# Patient Record
Sex: Male | Born: 1985 | Race: White | Hispanic: No | Marital: Married | Smoking: Never smoker
Health system: Southern US, Community
[De-identification: ages and names within clinical notes are randomized; demographics above are authoritative.]

## PROBLEM LIST (undated history)

## (undated) DIAGNOSIS — K602 Anal fissure, unspecified: Secondary | ICD-10-CM

## (undated) DIAGNOSIS — K648 Other hemorrhoids: Secondary | ICD-10-CM

## (undated) DIAGNOSIS — K9049 Malabsorption due to intolerance, not elsewhere classified: Secondary | ICD-10-CM

## (undated) HISTORY — DX: Anal fissure, unspecified: K60.2

## (undated) HISTORY — PX: WISDOM TOOTH EXTRACTION: SHX21

## (undated) HISTORY — DX: Other hemorrhoids: K64.8

## (undated) HISTORY — DX: Malabsorption due to intolerance, not elsewhere classified: K90.49

## (undated) HISTORY — PX: VASECTOMY: SHX75

---

## 2011-11-29 ENCOUNTER — Encounter: Payer: Self-pay | Admitting: Emergency Medicine

## 2011-11-29 ENCOUNTER — Ambulatory Visit (INDEPENDENT_AMBULATORY_CARE_PROVIDER_SITE_OTHER): Payer: BC Managed Care – PPO | Admitting: Family Medicine

## 2011-11-29 VITALS — BP 110/70 | HR 85 | Temp 98.1°F | Resp 16 | Ht 71.5 in | Wt 213.0 lb

## 2011-11-29 DIAGNOSIS — Z0000 Encounter for general adult medical examination without abnormal findings: Secondary | ICD-10-CM

## 2011-11-29 NOTE — Progress Notes (Signed)
    Date:  11/29/2011   Name:  Dennis Owens   DOB:  1985/12/30   MRN:  [id number]  PCP:  No primary provider on file.    Chief Complaint: Annual Exam   History of Present Illness:  Dennis Owens is a 26 y.o. 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  He is generally healthy, works at [employer] currently.  Exercises regularly.  No tobacco use.  He is married and has one child.    [name] does not want blood work today.  He had his last tetanus shot while in the [employer]- he states this was less than 10 years ago.  He also thinks that he has had a hep b series already.    No trouble with CP, SOB, syncope, etc while exercising.    There is no problem list on file for this patient.  No past medical history on file. No past surgical history on file. History  Substance Use Topics  . Smoking status: Never Smoker   . Smokeless tobacco: Not on file  . Alcohol Use: Not on file   No family history on file. Allergies  Allergen Reactions  . Penicillins Rash    Medication list has been reviewed and updated.  No current outpatient prescriptions on file prior to visit.    Review of Systems:  As per HPI- otherwise negative.   Physical Examination: Filed Vitals:   11/29/11 0806  BP: 110/70  Pulse: 85  Temp: 98.1 F (36.7 C)  Resp: 16   Filed Vitals:   11/29/11 0806  Height: 5' 11.5" (1.816 m)  Weight: 213 lb (96.616 kg)   Body mass index is 29.29 kg/(m^2). Ideal Body Weight: Weight in (lb) to have BMI = 25: 181.4   GEN: WDWN, NAD, Non-toxic, A & O x 3 HEENT: Atraumatic, Normocephalic. Neck supple. No masses, No LAD.  PEERL, EOMI, TM and oropharynx wnl Ears and Nose: No external deformity. CV: RRR, No M/G/R. No JVD. No thrill. No extra heart sounds. PULM: CTA B, no wheezes, crackles, rhonchi. No retractions. No resp. distress. No accessory muscle use. ABD: S, NT, ND, +BS. No rebound. No  HSM. EXTR: No c/c/e NEURO Normal gait. Full strength and ROM all extremities, normal DTR PSYCH: Normally interactive. Conversant. Not depressed or anxious appearing.  Calm demeanor.  GU: no masses, normal external genitalia, no inguinal hernias    Assessment and Plan: 1. 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  Gave hand out "staying healthy."  He will let us know if we can do anything else to help Abbe Amsterdam, MD

## 2012-12-11 ENCOUNTER — Ambulatory Visit (INDEPENDENT_AMBULATORY_CARE_PROVIDER_SITE_OTHER): Payer: BC Managed Care – PPO | Admitting: Family Medicine

## 2012-12-11 VITALS — BP 122/80 | HR 84 | Temp 98.3°F | Resp 18 | Ht 72.0 in | Wt 214.0 lb

## 2012-12-11 DIAGNOSIS — T148XXA Other injury of unspecified body region, initial encounter: Secondary | ICD-10-CM

## 2012-12-11 DIAGNOSIS — M549 Dorsalgia, unspecified: Secondary | ICD-10-CM

## 2012-12-11 MED ORDER — CYCLOBENZAPRINE HCL 5 MG PO TABS: 5.0000 mg | ORAL_TABLET | Freq: Every evening | ORAL | Status: DC | PRN

## 2012-12-11 NOTE — Progress Notes (Signed)
 [facility]:  Office Visit  Chief Complaint:  Chief Complaint  Patient presents with  . Shoulder Pain    left shoulder since sunday     HPI: Dennis Owens is a 27 y.o. male who complains of  Left shoulder blade pain, really sore. He changed the breaks this weekend. NKI. He has had shoulde rstrain previously in the past. He took Aleve with some relief. Last shoulder problems last year, right shoulder. No neck pain, No numbness or ringling. Overhead lifting makes pain worse. No weakness. 10/10 sharp pain this AM, IN the back of the shoulder. Goes to  [facility].  HE works at [employer] in [employer]. He does a lot of lifting, 150 lb is heaviest.   History reviewed. No pertinent past medical history. History reviewed. No pertinent past surgical history. History   Social History  . Marital Status: Married    Spouse Name: N/A    Number of Children: N/A  . Years of Education: N/A   Social History Main Topics  . Smoking status: Never Smoker   . Smokeless tobacco: None  . Alcohol Use: None  . Drug Use: None  . Sexually Active: None   Other Topics Concern  . None   Social History Narrative  . None   History reviewed. No pertinent family history. Allergies  Allergen Reactions  . Penicillins Rash   Prior to Admission medications   Medication Sig Start Date End Date Taking? Authorizing Provider  Acetaminophen (TYNOL 8 HOUR PO) Take by mouth.   Yes Historical Provider, MD     ROS: The patient denies fevers, chills, night sweats, unintentional weight loss, chest pain, palpitations, wheezing, dyspnea on exertion, nausea, vomiting, abdominal pain, dysuria, hematuria, melena, numbness, weakness, or tingling.   All other systems have been reviewed and were otherwise negative with the exception of those mentioned in the HPI and as above.    PHYSICAL EXAM: Filed Vitals:   12/11/12 0856  BP: 122/80  Pulse: 84  Temp: 98.3 F (36.8 C)  Resp: 18   Filed Vitals:   12/11/12 0856  Height: 6' (1.829 m)  Weight: 214 lb (97.07 kg)   Body mass index is 29.02 kg/(m^2).  General: Alert, no acute distress HEENT:  Normocephalic, atraumatic, oropharynx patent.  Cardiovascular:  Regular rate and rhythm, no rubs murmurs or gallops.  No Carotid bruits, radial pulse intact. No pedal edema.  Respiratory: Clear to auscultation bilaterally.  No wheezes, rales, or rhonchi.  No cyanosis, no use of accessory musculature GI: No organomegaly, abdomen is soft and non-tender, positive bowel sounds.  No masses. Skin: No rashes. Neurologic: Facial musculature symmetric. Psychiatric: Patient is appropriate throughout our interaction. Lymphatic: No cervical lymphadenopathy Musculoskeletal: Gait intact. Neck normal, neg spurling Back-left back, bladder, + tender at shoulder balde angle Full ROM, neg Hawkins,Neers, 5/5 sterngth, 2/2 DTR  LABS: No results found for this or any previous visit.   EKG/XRAY:   Primary read interpreted by Dr. Conley Rolls at [facility].   ASSESSMENT/PLAN: Encounter Diagnoses  Name Primary?  . Back pain Yes  . Sprain and strain    Continue with Aleve Rx for flexeril given, take as needed Work note given ROM exercises given F/u prn Declined xrays Gross sideeffects, risk and benefits, and alternatives of medications d/w patient. Patient is aware that all medications have potential sideeffects and we are unable to predict every sideeffect or drug-drug interaction that may occur.     ,  PHUONG, DO 12/11/2012  9:33 AM

## 2012-12-11 NOTE — Patient Instructions (Signed)

## 2013-09-26 LAB — HM COLONOSCOPY

## 2013-10-16 ENCOUNTER — Emergency Department (HOSPITAL_COMMUNITY)
Admission: EM | Admit: 2013-10-16 | Discharge: 2013-10-16 | Disposition: A | Discharge status: Home / Self Care | Payer: BC Managed Care – PPO | Source: Home / Self Care | Attending: Family Medicine | Admitting: Family Medicine

## 2013-10-16 ENCOUNTER — Emergency Department (INDEPENDENT_AMBULATORY_CARE_PROVIDER_SITE_OTHER): Payer: BC Managed Care – PPO

## 2013-10-16 ENCOUNTER — Encounter (HOSPITAL_COMMUNITY): Payer: Self-pay | Admitting: Emergency Medicine

## 2013-10-16 DIAGNOSIS — X58XXXA Exposure to other specified factors, initial encounter: Secondary | ICD-10-CM

## 2013-10-16 DIAGNOSIS — S6000XA Contusion of unspecified finger without damage to nail, initial encounter: Secondary | ICD-10-CM

## 2013-10-16 NOTE — ED Provider Notes (Signed)
Dennis Owens is a 28 y.o. male who presents to [facility] today for left third digit injury. Injury occurred yesterday while moving heavy cardboard boxes. His finger was crushed. He notes pain at the PIP. Pain with flexion. No radiating pain weakness or numbness. He feels well otherwise. Pain is moderate. Injury occurred At home   History reviewed. No pertinent past medical history. History  Substance Use Topics  . Smoking status: Never Smoker   . Smokeless tobacco: Not on file  . Alcohol Use: Yes   ROS as above Medications: No current facility-administered medications for this encounter.   Current Outpatient Prescriptions  Medication Sig Dispense Refill  . Acetaminophen (TYLENOL 8 HOUR PO) Take by mouth.      . cyclobenzaprine (FLEXERIL) 5 MG tablet Take 1 tablet (5 mg total) by mouth at bedtime as needed.  15 tablet  0    Exam:  BP 142/71  Pulse 80  Temp(Src) 98 F (36.7 C) (Oral)  Resp 12  SpO2 100% Gen: Well NAD Left hand: Mild swelling at the proximal left third digit. Pain with flexion with reduced range of motion. Capillary refill sensation and pulses are intact.  No results found for this or any previous visit (from the past 24 hour(s)). Dg Finger Middle Left  10/16/2013   CLINICAL DATA:  Left middle finger pain and swelling post injury yesterday  EXAM: LEFT MIDDLE FINGER 2+V  COMPARISON:  None.  FINDINGS: Three views of left third finger submitted. No acute fracture or subluxation. A ring noted on fourth finger.  IMPRESSION: Negative.   Electronically Signed   By: Natasha Mead M.D.   On: 10/16/2013 12:48    Assessment and Plan: 28 y.o. male with finger contusion. Plan for buddy tape and NSAIDs.  Discussed warning signs or symptoms. Please see discharge instructions. Patient expresses understanding.    Rodolph Bong, MD 10/16/13 1400

## 2013-10-16 NOTE — Discharge Instructions (Signed)
Thank you for coming in today. Take up to 2 aleve twice daily for pain as needed.  Buddy tape the fingers.  Come back as needed.    Contusion A contusion is a deep bruise. Contusions are the result of an injury that caused bleeding under the skin. The contusion may turn blue, purple, or yellow. Minor injuries will give you a painless contusion, but more severe contusions may stay painful and swollen for a few weeks.  CAUSES  A contusion is usually caused by a blow, trauma, or direct force to an area of the body. SYMPTOMS   Swelling and redness of the injured area.  Bruising of the injured area.  Tenderness and soreness of the injured area.  Pain. DIAGNOSIS  The diagnosis can be made by taking a history and physical exam. An X-ray, CT scan, or MRI may be needed to determine if there were any associated injuries, such as fractures. TREATMENT  Specific treatment will depend on what area of the body was injured. In general, the best treatment for a contusion is resting, icing, elevating, and applying cold compresses to the injured area. Over-the-counter medicines may also be recommended for pain control. Ask your caregiver what the best treatment is for your contusion. HOME CARE INSTRUCTIONS   Put ice on the injured area.  Put ice in a plastic bag.  Place a towel between your skin and the bag.  Leave the ice on for 15-20 minutes, 03-04 times a day.  Only take over-the-counter or prescription medicines for pain, discomfort, or fever as directed by your caregiver. Your caregiver may recommend avoiding anti-inflammatory medicines (aspirin, ibuprofen, and naproxen) for 48 hours because these medicines may increase bruising.  Rest the injured area.  If possible, elevate the injured area to reduce swelling. SEEK IMMEDIATE MEDICAL CARE IF:   You have increased bruising or swelling.  You have pain that is getting worse.  Your swelling or pain is not relieved with medicines. MAKE SURE  YOU:   Understand these instructions.  Will watch your condition.  Will get help right away if you are not doing well or get worse. Document Released: 02/15/2005 Document Revised: 07/31/2011 Document Reviewed: 03/13/2011 [facility] Patient Information 2014 [place], [place].

## 2013-10-16 NOTE — ED Notes (Signed)
Pt reports inj to right middle finger onset yest am Reports he smashed finger while moving boxes (cardboard) 40-50 lbs Sx include swelling and pain Alert w/no signs of acute distress.

## 2014-11-11 LAB — BASIC METABOLIC PANEL
BUN: 14 mg/dL (ref 4–21)
CREATININE: 0.9 mg/dL (ref 0.6–1.3)
GLUCOSE: 98 mg/dL
POTASSIUM: 4.6 mmol/L (ref 3.4–5.3)
SODIUM: 138 mmol/L (ref 137–147)

## 2014-11-11 LAB — THYROID PANEL
FREE T4: 1.06
Free T3: 4.46
TSH: 0.57

## 2014-11-11 LAB — ACUTE HEP PANEL AND HEP B SURFACE AB
HEP B C IGM: NEGATIVE
Hep A IgM: NEGATIVE
Hepatitis B Surface Antigen: NEGATIVE
Hepatitis C Ab: NEGATIVE

## 2014-11-11 LAB — HEPATIC FUNCTION PANEL
ALT: 103 U/L — AB (ref 10–40)
AST: 58 U/L — AB (ref 14–40)
Alkaline Phosphatase: 52 U/L (ref 25–125)
Bilirubin, Total: 0.8 mg/dL

## 2014-11-11 LAB — CBC AND DIFFERENTIAL
HCT: 43 % (ref 41–53)
Hemoglobin: 14.7 g/dL (ref 13.5–17.5)
Platelets: 171 10*3/uL (ref 150–399)
WBC: 5 10*3/mL

## 2014-11-11 LAB — CK: CPK 2 (MB): 22

## 2014-11-11 LAB — HEMOGLOBIN A1C: Hemoglobin A1C: 5.1

## 2016-05-10 ENCOUNTER — Ambulatory Visit (INDEPENDENT_AMBULATORY_CARE_PROVIDER_SITE_OTHER): Payer: BLUE CROSS/BLUE SHIELD | Admitting: Family Medicine

## 2016-05-10 ENCOUNTER — Encounter: Payer: Self-pay | Admitting: Family Medicine

## 2016-05-10 VITALS — BP 127/82 | HR 69 | Ht 72.0 in | Wt 239.9 lb

## 2016-05-10 DIAGNOSIS — E66811 Obesity, class 1: Secondary | ICD-10-CM | POA: Insufficient documentation

## 2016-05-10 DIAGNOSIS — Z833 Family history of diabetes mellitus: Secondary | ICD-10-CM | POA: Diagnosis not present

## 2016-05-10 DIAGNOSIS — E669 Obesity, unspecified: Secondary | ICD-10-CM

## 2016-05-10 NOTE — Progress Notes (Signed)
New patient office visit note:  Impression and Recommendations:    1. Obesity, Class I, BMI 30-34.9   2. 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  If interested in actively managing weight:  Use lose it or my fitness pal---> track all food and drinks.   Discussed with patient importance of weight loss to help achieve health goals and how increasing weight, correlates to increasing risk of various diseases. (or increasing risk of not controlling existing diseases.)  F/up sooner than planned if you would like to further discuss strategies to lose weight  - Weight watchers recommended;   Declines nutrition counseling with dietary specialist  Family history of diabetes mellitus- MGM Will obtain labs including a1c     Orders Placed This Encounter  Procedures  . CBC with Differential/Platelet  . COMPLETE METABOLIC PANEL WITH GFR  . Hemoglobin A1c  . Lipid panel  . T4, free  . TSH  . VITAMIN D 25 Hydroxy (Vit-D Deficiency, Fractures)    New Prescriptions   No medications on file    Modified Medications   No medications on file    Discontinued Medications   ACETAMINOPHEN (TYLENOL 8 HOUR PO)    Take by mouth.   CYCLOBENZAPRINE (FLEXERIL) 5 MG TABLET    Take 1 tablet (5 mg total) by mouth at bedtime as needed.     The patient was counseled, risk factors were discussed, anticipatory guidance given.  Gross side effects, risk and benefits, and alternatives of medications discussed with patient.  Patient is aware that all medications have potential side effects and we are unable to predict every side effect or drug-drug interaction that may occur.  Expresses verbal understanding and consents to current therapy plan and treatment regimen.  Return for For wellness exam & health maintenance eval  3mo .  Please see AVS handed out to patient at the end of our visit for further patient instructions/  counseling done pertaining to today's office visit.    Note: This document was prepared using Dragon voice recognition software and may include unintentional dictation errors.  ----------------------------------------------------------------------------------------------------------------------    Subjective:    Chief Complaint  Patient presents with  . Establish Care    HPI: Dennis Owens is a pleasant 30 y.o. male who [facility] to review their medical history with me and establish care.   I asked the patient to review their chronic problem list with me to ensure everything was updated and accurate.    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   2 kids- boy and girl 2 and 6;  Married to Dennis Owens- 10 yrs.   Works 4 - 10 hour days.   Patient concerned with being overweight.  He would like to discuss ways to go about losing- not sure what to do/ eat etc.   Wt Readings from Last 3 Encounters:  05/10/16 239 lb 14.4 oz (108.8 kg)  12/11/12 214 lb (97.1 kg)  11/29/11 213 lb (96.6 kg)   BP Readings from Last 3 Encounters:  05/10/16 127/82  10/16/13 142/71  12/11/12 122/80   Pulse Readings from Last 3 Encounters:  05/10/16 69  10/16/13 80  12/11/12 84   BMI Readings from Last 3 Encounters:  05/10/16 32.54 kg/m  12/11/12 29.02 kg/m  11/29/11 29.29 kg/m    Patient Care Team    Relationship  Specialty Notifications Start End  Thomasene Loteborah Daily Doe, DO PCP - General Family Medicine  05/10/16   Laurell Roofimothy Misenheimer, MD Consulting Physician Unknown Physician Specialty  05/10/16     Patient Active Problem List   Diagnosis Date Noted  . Obesity, Class I, BMI 30-34.9 05/10/2016    Priority: High  . Family history of diabetes mellitus- MGM 05/10/2016     No past medical history on file.   No past medical history on file.   Past Surgical History:  Procedure Laterality Date  . WISDOM TOOTH EXTRACTION       Family History  Problem  Relation Age of Onset  . Healthy Mother   . Healthy Father   . Healthy Sister   . Healthy Brother   . Healthy Daughter   . Healthy Son   . Diabetes Maternal Grandmother   . Healthy Brother      History  Drug Use No    History  Alcohol Use  . Yes    Comment: occassionally    History  Smoking Status  . Never Smoker  Smokeless Tobacco  . Never Used     Patient's Medications  New Prescriptions   No medications on file  Previous Medications   No medications on file  Modified Medications   No medications on file  Discontinued Medications   ACETAMINOPHEN (TYLENOL 8 HOUR PO)    Take by mouth.   CYCLOBENZAPRINE (FLEXERIL) 5 MG TABLET    Take 1 tablet (5 mg total) by mouth at bedtime as needed.    Allergies: Penicillins  Review of Systems  Constitutional: Negative for diaphoresis, fever and weight loss.  HENT: Negative for tinnitus.   Eyes: Negative for blurred vision and double vision.  Respiratory: Negative for cough and wheezing.   Cardiovascular: Negative for chest pain and palpitations.  Gastrointestinal: Negative for blood in stool, diarrhea, nausea and vomiting.  Musculoskeletal: Negative for joint pain and myalgias.  Skin: Negative for rash.  Neurological: Negative for weakness and headaches.  Endo/Heme/Allergies: Negative for environmental allergies. Does not bruise/bleed easily.  Psychiatric/Behavioral: Negative for depression and memory loss. The patient is not nervous/anxious and does not have insomnia.      Objective:    Blood pressure 127/82, pulse 69, height 6' (1.829 m), weight 239 lb 14.4 oz (108.8 kg). Body mass index is 32.54 kg/m. General: Well Developed, well nourished, and in no acute distress.  Neuro: Alert and oriented x3, extra-ocular muscles intact, sensation grossly intact.  HEENT: Normocephalic, atraumatic, pupils equal round reactive to light, neck supple Skin: no gross rashes  Cardiac: Regular rate and rhythm Respiratory:  Essentially clear to auscultation bilaterally. Not using accessory muscles, speaking in full sentences.  Abdominal: not grossly distended Musculoskeletal: Ambulates w/o diff, FROM * 4 ext.  Vasc: less 2 sec cap RF, warm and pink  Psych:  No HI/SI, judgement and insight good, Euthymic mood. Full Affect.

## 2016-05-10 NOTE — Patient Instructions (Addendum)
Lose it app-   track everything you put in your mouth  30 min aerobic actvity of moderate intensity   Guidelines for Losing Weight   We want weight loss that will last so you should lose 1-2 pounds a week.  THAT IS IT! Please pick THREE things a month to change. Once it is a habit check off the item. Then pick another three items off the list to become habits.  If you are already doing a habit on the list GREAT!  Cross that item off!  Don't drink your calories. Ie, alcohol, soda, fruit juice, and sweet tea.   Drink more water. Drink a glass when you feel hungry or before each meal.   Eat breakfast - Complex carb and protein (likeDannon light and fit yogurt, oatmeal, fruit, eggs, [place] bacon).  Measure your cereal.  Eat no more than one cup a day. (ie Kashi)  Eat an apple a day.  Add a vegetable a day.  Try a new vegetable a month.  Use Pam! Stop using oil or butter to cook.  Don't finish your plate or use smaller plates.  Share your dessert.  Eat sugar free Jello for dessert or frozen grapes.  Don't eat 2-3 hours before bed.  Switch to whole wheat bread, pasta, and brown rice.  Make healthier choices when you eat out. No fries!  Pick baked chicken, NOT fried.  Don't forget to SLOW DOWN when you eat. It is not going anywhere.   Take the stairs.  Park far away in the parking lot  Lift soup cans (or weights) for 10 minutes while watching TV.  Walk at work for 10 minutes during break.  Walk outside 1 time a week with your friend, kids, dog, or significant other.  Start a walking group at church.  Walk the mall as much as you can tolerate.   Keep a food diary.  Weigh yourself daily.  Walk for 15 minutes 3 days per week.  Cook at home more often and eat out less. If life happens and you go back to old habits, it is okay.  Just start over. You can do it!  If you experience chest pain, get short of breath, or tired during the exercise, please stop immediately  and inform your doctor.    Before you even begin to attack a weight-loss plan, it pays to remember this: You are not fat. You have fat. Losing weight isn't about blame or shame; it's simply another achievement to accomplish. Dieting is like any other skill-you have to buckle down and work at it. As long as you act in a smart, reasonable way, you'll ultimately get where you want to be. Here are some weight loss pearls for you.   1. It's Not a Diet. It's a Lifestyle Thinking of a diet as something you're on and suffering through only for the short term doesn't work. To shed weight and keep it off, you need to make permanent changes to the way you eat. It's OK to indulge occasionally, of course, but if you cut calories temporarily and then revert to your old way of eating, you'll gain back the weight quicker than you can say yo-yo. Use it to lose it. Research shows that one of the best predictors of long-term weight loss is how many pounds you drop in the first month. For that reason, nutritionists often suggest being stricter for the first two weeks of your new eating strategy to build momentum. Cut out added  sugar and alcohol and avoid unrefined carbs. After that, figure out how you can reincorporate them in a way that's healthy and maintainable.  2. There's a Right Way to Exercise Working out burns calories and fat and boosts your metabolism by building muscle. But those trying to lose weight are notorious for overestimating the number of calories they burn and underestimating the amount they take in. Unfortunately, your system is biologically programmed to hold on to extra pounds and that means when you start exercising, your body senses the deficit and ramps up its hunger signals. If you're not diligent, you'll eat everything you burn and then some. Use it, to lose it. Cardio gets all the exercise glory, but strength and interval training are the real heroes. They help you build lean muscle, which in  turn increases your metabolism and calorie-burning ability 3. Don't Overreact to Mild Hunger Some people have a hard time losing weight because of hunger anxiety. To them, being hungry is bad-something to be avoided at all costs-so they carry snacks with them and eat when they don't need to. Others eat because they're stressed out or bored. While you never want to get to the point of being ravenous (that's when bingeing is likely to happen), a hunger pang, a craving, or the fact that it's 3:00 p.m. should not send you racing for the vending machine or obsessing about the energy bar in your purse. Ideally, you should put off eating until your stomach is growling and it's difficult to concentrate.  Use it to lose it. When you feel the urge to eat, use the HALT method. Ask yourself, Am I really hungry? Or am I angry or anxious, lonely or bored, or tired? If you're still not certain, try the apple test. If you're truly hungry, an apple should seem delicious; if it doesn't, something else is going on. Or you can try drinking water and making yourself busy, if you are still hungry try a healthy snack.  4. Not All Calories Are Created Equal The mechanics of weight loss are pretty simple: Take in fewer calories than you use for energy. But the kind of food you eat makes all the difference. Processed food that's high in saturated fat and refined starch or sugar can cause inflammation that disrupts the hormone signals that tell your brain you're full. The result: You eat a lot more.  Use it to lose it. Clean up your diet. Swap in whole, unprocessed foods, including vegetables, lean protein, and healthy fats that will fill you up and give you the biggest nutritional bang for your calorie buck. In a few weeks, as your brain starts receiving regular hunger and fullness signals once again, you'll notice that you feel less hungry overall and naturally start cutting back on the amount you eat.  5. Protein, Produce, and  Plant-Based Fats Are Your Weight-Loss Trinity Here's why eating the three Ps regularly will help you drop pounds. Protein fills you up. You need it to build lean muscle, which keeps your metabolism humming so that you can torch more fat. People in a weight-loss program who ate double the recommended daily allowance for protein (about 110 grams for a 150-pound woman) lost 70 percent of their weight from fat, while people who ate the RDA lost only about 40 percent, one study found. Produce is packed with filling fiber. "It's very difficult to consume too many calories if you're eating a lot of vegetables. Example: Three cups of broccoli is a lot of  food, yet only 93 calories. (Fruit is another story. It can be easy to overeat and can contain a lot of calories from sugar, so be sure to monitor your intake.) Plant-based fats like olive oil and those in avocados and nuts are healthy and extra satiating.  Use it to lose it. Aim to incorporate each of the three Ps into every meal and snack. People who eat protein throughout the day are able to keep weight off, according to a study in the [place] [employer]. In addition to meat, poultry and seafood, good sources are beans, lentils, eggs, tofu, and yogurt. As for fat, keep portion sizes in check by measuring out salad dressing, oil, and nut butters (shoot for one to two tablespoons). Finally, eat veggies or a little fruit at every meal. People who did that consumed 308 fewer calories but didn't feel any hungrier than when they didn't eat more produce.  7. How You Eat Is As Important As What You Eat In order for your brain to register that you're full, you need to focus on what you're eating. Sit down whenever you eat, preferably at a table. Turn off the TV or computer, put down your phone, and look at your food. Smell it. Chew slowly, and don't put another bite on your fork until you swallow. When women ate lunch this attentively, they consumed 30  percent less when snacking later than those who listened to an audiobook at lunchtime, according to a study in the [place] of Nutrition. 8. Weighing Yourself Really Works The scale provides the best evidence about whether your efforts are paying off. Seeing the numbers tick up or down or stagnate is motivation to keep going-or to rethink your approach. A 2015 study at [facility] found that daily weigh-ins helped people lose more weight, keep it off, and maintain that loss, even after two years. Use it to lose it. Step on the scale at the same time every day for the best results. If your weight shoots up several pounds from one weigh-in to the next, don't freak out. Eating a lot of salt the night before or having your period is the likely culprit. The number should return to normal in a day or two. It's a steady climb that you need to do something about. 9. Too Much Stress and Too Little Sleep Are Your Enemies When you're tired and frazzled, your body cranks up the production of cortisol, the stress hormone that can cause carb cravings. Not getting enough sleep also boosts your levels of ghrelin, a hormone associated with hunger, while suppressing leptin, a hormone that signals fullness and satiety. People on a diet who slept only five and a half hours a night for two weeks lost 55 percent less fat and were hungrier than those who slept eight and a half hours, according to a study in the [place]. Use it to lose it. Prioritize sleep, aiming for seven hours or more a night, which research shows helps lower stress. And make sure you're getting quality zzz's. If a snoring spouse or a fidgety cat wakes you up frequently throughout the night, you may end up getting the equivalent of just four hours of sleep, according to a study from [facility]. Keep pets out of the bedroom, and use a white-noise app to drown out snoring. 10. You Will Hit a plateau-And You Can  Bust Through It As you slim down, your body releases much less leptin, the  fullness hormone.  If you're not strength training, start right now. Building muscle can raise your metabolism to help you overcome a plateau. To keep your body challenged and burning calories, incorporate new moves and more intense intervals into your workouts or add another sweat session to your weekly routine. Alternatively, cut an extra 100 calories or so a day from your diet. Now that you've lost weight, your body simply doesn't need as much fuel.      Since food equals calories, in order to lose weight you must either eat fewer calories, exercise more to burn off calories with activity, or both. Food that is not used to fuel the body is stored as fat. A major component of losing weight is to make smarter food choices. Here's how:  1)   Limit non-nutritious foods, such as: Sugar, honey, syrups and candy Pastries, donuts, pies, cakes and cookies Soft drinks, sweetened juices and alcoholic beverages  2)  Cut down on high-fat foods by: - Choosing poultry, fish or lean red meat - Choosing low-fat cooking methods, such as baking, broiling, steaming, grilling and boiling - Using low-fat or non-fat dairy products - Using vinaigrette, herbs, lemon or fat-free salad dressings - Avoiding fatty meats, such as bacon, sausage, franks, ribs and luncheon meats - Avoiding high-fat snacks like nuts, chips and chocolate - Avoiding fried foods - Using less butter, margarine, oil and mayonnaise - Avoiding high-fat gravies, cream sauces and cream-based soups  3) Eat a variety of foods, including: - Fruit and vegetables that are raw, steamed or baked - Whole grains, breads, cereal, rice and pasta - Dairy products, such as low-fat or non-fat milk or yogurt, low-fat cottage cheese and low-fat cheese - Protein-rich foods like chicken, [place], fish, lean meat and legumes, or beans  4) Change your eating habits by: - Eat three  balanced meals a day to help control your hunger - Watch portion sizes and eat small servings of a variety of foods - Choose low-calorie snacks - Eat only when you are hungry and stop when you are satisfied - Eat slowly and try not to perform other tasks while eating - Find other activities to distract you from food, such as walking, taking up a hobby or being involved in the community - Include regular exercise in your daily routine ( minimum of 20 min of moderate-intensity exercise at least 5 days/week)  - Find a support group, if necessary, for emotional support in your weight loss journey      Easy ways to cut 100 calories  1. Eat your eggs with hot sauce OR salsa instead of cheese.  Eggs are great for breakfast, but many people consider eggs and cheese to be BFFs. Instead of cheese-1 oz. of cheddar has 114 calories-top your eggs with hot sauce, which contains no calories and helps with satiety and metabolism. Salsa is also a great option!!  2. Top your toast, waffles or pancakes with fresh berries instead of jelly or syrup. Half a cup of berries-fresh, frozen or thawed-has about 40 calories, compared with 2 tbsp. of maple syrup or jelly, which both have about 100 calories. The berries will also give you a good punch of fiber, which helps keep you full and satisfied and won't spike blood sugar quickly like the jelly or syrup. 3. Swap the non-fat latte for black coffee with a splash of half-and-half. Contrary to its name, that non-fat latte has 130 calories and a startling 19g of carbohydrates per 16 oz. serving. Replacing  that 'light' drinkable dessert with a black coffee with a splash of half-and-half saves you more than 100 calories per 16 oz. serving. 4. Sprinkle salads with freeze-dried raspberries instead of dried cranberries. If you want a sweet addition to your nutritious salad, stay away from dried cranberries. They have a whopping 130 calories per  cup and 30g carbohydrates.  Instead, sprinkle freeze-dried raspberries guilt-free and save more than 100 calories per  cup serving, adding 3g of belly-filling fiber. 5. Go for mustard in place of mayo on your sandwich. Mustard can add really nice flavor to any sandwich, and there are tons of varieties, from spicy to honey. A serving of mayo is 95 calories, versus 10 calories in a serving of mustard.  Or try an avocado mayo spread: You can find the recipe few click this link: https://www.californiaavocado.com/recipes/recipe-container/[place]-avocado-mayo 6. Choose a DIY salad dressing instead of the store-bought kind. Mix Dijon or whole grain mustard with low-fat Kefir or red wine vinegar and garlic. 7. Use hummus as a spread instead of a dip. Use hummus as a spread on a high-fiber cracker or tortilla with a sandwich and save on calories without sacrificing taste. 8. Pick just one salad "accessory." Salad isn't automatically a calorie winner. It's easy to over-accessorize with toppings. Instead of topping your salad with nuts, avocado and cranberries (all three will clock in at 313 calories), just pick one. The next day, choose a different accessory, which will also keep your salad interesting. You don't wear all your jewelry every day, right? 9. Ditch the white pasta in favor of spaghetti squash. One cup of cooked spaghetti squash has about 40 calories, compared with traditional spaghetti, which comes with more than 200. Spaghetti squash is also nutrient-dense. It's a good source of fiber and Vitamins A and C, and it can be eaten just like you would eat pasta-with a great tomato sauce and [place] meatballs or with pesto, tofu and spinach, for example. 10. Dress up your chili, soups and stews with non-fat [place] yogurt instead of sour cream. Just a 'dollop' of sour cream can set you back 115 calories and a whopping 12g of fat-seven of which are of the artery-clogging variety. Added bonus: [place] yogurt is packed with  muscle-building protein, calcium and B Vitamins. 11. Mash cauliflower instead of mashed potatoes. One cup of traditional mashed potatoes-in all their creamy goodness-has more than 200 calories, compared to mashed cauliflower, which you can typically eat for less than 100 calories per 1 cup serving. Cauliflower is a great source of the antioxidant indole-3-carbinol (I3C), which may help reduce the risk of some cancers, like breast cancer. 12. Ditch the ice cream sundae in favor of a [place] yogurt parfait. Instead of a cup of ice cream or fro-yo for dessert, try 1 cup of nonfat Greek yogurt topped with fresh berries and a sprinkle of cacao nibs. Both toppings are packed with antioxidants, which can help reduce cellular inflammation and oxidative damage. And the comparison is a no-brainer: One cup of ice cream has about 275 calories; one cup of frozen yogurt has about 230; and a cup of Greek yogurt has just 130, plus twice the protein, so you're less likely to return to the freezer for a second helping. 13. Put olive oil in a spray container instead of using it directly from the bottle. Each tablespoon of olive oil is 120 calories and 15g of fat. Use a mister instead of pouring it straight into the pan or onto a salad. This allows  for portion control and will save you more than 100 calories. 14. When baking, substitute canned pumpkin for butter or oil. Canned pumpkin-not pumpkin pie mix-is loaded with Vitamin A, which is important for skin and eye health, as well as immunity. And the comparisons are pretty crazy:  cup of canned pumpkin has about 40 calories, compared to butter or oil, which has more than 800 calories. Yes, 800 calories. Applesauce and mashed banana can also serve as good substitutions for butter or oil, usually in a 1:1 ratio. 15. Top casseroles with high-fiber cereal instead of breadcrumbs. Breadcrumbs are typically made with white bread, while breakfast cereals contain 5-9g of fiber per  serving. Not only will you save more than 150 calories per  cup serving, the swap will also keep you more full and you'll get a metabolism boost from the added fiber. 16. Snack on pistachios instead of macadamia nuts. Believe it or not, you get the same amount of calories from 35 pistachios (100 calories) as you would from only five macadamia nuts. 17. Chow down on kale chips rather than potato chips. This is my favorite 'don't knock it 'till you try it' swap. Kale chips are so easy to make at home, and you can spice them up with a little grated parmesan or chili powder. Plus, they're a mere fraction of the calories of potato chips, but with the same crunch factor we crave so often. 18. Add seltzer and some fruit slices to your cocktail instead of soda or fruit juice. One cup of soda or fruit juice can pack on as much as 140 calories. Instead, use seltzer and fruit slices. The fruit provides valuable phytochemicals, such as flavonoids and anthocyanins, which help to combat cancer and stave off the aging process.      Phentermine  While taking the medication we may ask that you come into the office once a month or once every 2-3 months to monitor your weight, blood pressure, and heart rate. In addition we can help answer your questions about diet, exercise, and help you every step of the way with your weight loss journey. Sometime it is helpful if you bring in a food diary or use an app on your phone such as myfitnesspal to record your calorie intake, especially in the beginning.   You can start out on 1/3 to 1/2 a pill in the morning and if you are tolerating it well you can increase to one pill daily. I also have some patients that take 1/3 or 1/2 at lunch to help prevent night time eating.  This medication is cheapest CASH pay at [facility] is 14-17 dollars and you do NOT need a membership to get meds from there.    What is this medicine? PHENTERMINE (FEN ter meen)  decreases your appetite. This medicine is intended to be used in addition to a healthy reduced calorie diet and exercise. The best results are achieved this way. This medicine is only indicated for short-term use. Eventually your weight loss may level out and the medication will no longer be needed.   How should I use this medicine? Take this medicine by mouth. Follow the directions on the prescription label. The tablets should stay in the bottle until immediately before you take your dose. Take your doses at regular intervals. Do not take your medicine more often than directed.  Overdosage: If you think you have taken too much of this medicine contact a poison control center or  emergency room at once. NOTE: This medicine is only for you. Do not share this medicine with others.  What if I miss a dose? If you miss a dose, take it as soon as you can. If it is almost time for your next dose, take only that dose. Do not take double or extra doses. Do not increase or in any way change your dose without consulting your doctor.  What should I watch for while using this medicine? Notify your physician immediately if you become short of breath while doing your normal activities. Do not take this medicine within 6 hours of bedtime. It can keep you from getting to sleep. Avoid drinks that contain caffeine and try to stick to a regular bedtime every night. Do not stand or sit up quickly, especially if you are an older patient. This reduces the risk of dizzy or fainting spells. Avoid alcoholic drinks.  What side effects may I notice from receiving this medicine? Side effects that you should report to your doctor or health care professional as soon as possible: -chest pain, palpitations -depression or severe changes in mood -increased blood pressure -irritability -nervousness or restlessness -severe dizziness -shortness of breath -problems urinating -unusual swelling of the legs -vomiting  Side  effects that usually do not require medical attention (report to your doctor or health care professional if they continue or are bothersome): -blurred vision or other eye problems -changes in sexual ability or desire -constipation or diarrhea -difficulty sleeping -dry mouth or unpleasant taste -headache -nausea This list may not describe all possible side effects. Call your doctor for medical advice about side effects. You may report side effects to FDA at 1-800-FDA-1088.

## 2016-06-01 ENCOUNTER — Other Ambulatory Visit: Payer: BLUE CROSS/BLUE SHIELD

## 2016-06-08 ENCOUNTER — Other Ambulatory Visit: Payer: BLUE CROSS/BLUE SHIELD

## 2016-06-09 ENCOUNTER — Telehealth: Payer: Self-pay | Admitting: Family Medicine

## 2016-06-09 NOTE — Telephone Encounter (Signed)
Cld and rescheduled pt for labs on 1/22 @ 8:30( office clsd/missed BD on 1/18 due to inclement weather)--glh.

## 2016-06-10 NOTE — Assessment & Plan Note (Signed)
Will obtain labs including a1c

## 2016-06-10 NOTE — Assessment & Plan Note (Signed)
Counseling done- advised wt loss.  If interested in actively managing weight:  Use lose it or my fitness pal---> track all food and drinks.   Discussed with patient importance of weight loss to help achieve health goals and how increasing weight, correlates to increasing risk of various diseases. (or increasing risk of not controlling existing diseases.)  F/up sooner than planned if you would like to further discuss strategies to lose weight  - Weight watchers recommended;   Declines nutrition counseling with dietary specialist 

## 2016-06-12 ENCOUNTER — Other Ambulatory Visit: Payer: BLUE CROSS/BLUE SHIELD

## 2016-06-19 ENCOUNTER — Other Ambulatory Visit (INDEPENDENT_AMBULATORY_CARE_PROVIDER_SITE_OTHER): Payer: BLUE CROSS/BLUE SHIELD

## 2016-06-19 DIAGNOSIS — E669 Obesity, unspecified: Secondary | ICD-10-CM

## 2016-06-19 DIAGNOSIS — Z833 Family history of diabetes mellitus: Secondary | ICD-10-CM

## 2016-06-19 NOTE — Addendum Note (Signed)
Addended by: Stan HeadNELSON, TONYA S on: 06/19/2016 09:18 AM   Modules accepted: Orders

## 2016-06-20 LAB — COMPREHENSIVE METABOLIC PANEL
ALBUMIN: 4.7 g/dL (ref 3.5–5.5)
ALT: 111 IU/L — ABNORMAL HIGH (ref 0–44)
AST: 60 IU/L — ABNORMAL HIGH (ref 0–40)
Albumin/Globulin Ratio: 1.7 (ref 1.2–2.2)
Alkaline Phosphatase: 64 IU/L (ref 39–117)
BILIRUBIN TOTAL: 0.6 mg/dL (ref 0.0–1.2)
BUN / CREAT RATIO: 11 (ref 9–20)
BUN: 10 mg/dL (ref 6–20)
CO2: 25 mmol/L (ref 18–29)
CREATININE: 0.88 mg/dL (ref 0.76–1.27)
Calcium: 9.7 mg/dL (ref 8.7–10.2)
Chloride: 100 mmol/L (ref 96–106)
GFR calc non Af Amer: 116 mL/min/{1.73_m2} (ref >59)
GFR, EST AFRICAN AMERICAN: 134 mL/min/{1.73_m2} (ref >59)
GLOBULIN, TOTAL: 2.8 g/dL (ref 1.5–4.5)
GLUCOSE: 90 mg/dL (ref 65–99)
Potassium: 4.5 mmol/L (ref 3.5–5.2)
SODIUM: 141 mmol/L (ref 134–144)
TOTAL PROTEIN: 7.5 g/dL (ref 6.0–8.5)

## 2016-06-20 LAB — CBC WITH DIFFERENTIAL/PLATELET
Basophils Absolute: 0 10*3/uL (ref 0.0–0.2)
Basos: 0 %
EOS (ABSOLUTE): 0.1 10*3/uL (ref 0.0–0.4)
EOS: 2 %
HEMATOCRIT: 44.6 % (ref 37.5–51.0)
HEMOGLOBIN: 15.2 g/dL (ref 13.0–17.7)
Immature Grans (Abs): 0 10*3/uL (ref 0.0–0.1)
Immature Granulocytes: 0 %
LYMPHS ABS: 2.1 10*3/uL (ref 0.7–3.1)
Lymphs: 36 %
MCH: 31.1 pg (ref 26.6–33.0)
MCHC: 34.1 g/dL (ref 31.5–35.7)
MCV: 91 fL (ref 79–97)
MONOS ABS: 0.6 10*3/uL (ref 0.1–0.9)
Monocytes: 10 %
NEUTROS ABS: 3 10*3/uL (ref 1.4–7.0)
Neutrophils: 52 %
Platelets: 252 10*3/uL (ref 150–379)
RBC: 4.88 x10E6/uL (ref 4.14–5.80)
RDW: 13.9 % (ref 12.3–15.4)
WBC: 5.8 10*3/uL (ref 3.4–10.8)

## 2016-06-20 LAB — LIPID PANEL
CHOLESTEROL TOTAL: 136 mg/dL (ref 100–199)
Chol/HDL Ratio: 2.8 ratio units (ref 0.0–5.0)
HDL: 48 mg/dL (ref >39)
LDL Calculated: 62 mg/dL (ref 0–99)
TRIGLYCERIDES: 130 mg/dL (ref 0–149)
VLDL Cholesterol Cal: 26 mg/dL (ref 5–40)

## 2016-06-20 LAB — HEMOGLOBIN A1C
Est. average glucose Bld gHb Est-mCnc: 97 mg/dL
Hgb A1c MFr Bld: 5 % (ref 4.8–5.6)

## 2016-06-20 LAB — T4, FREE: FREE T4: 1.1 ng/dL (ref 0.82–1.77)

## 2016-06-20 LAB — TSH: TSH: 0.416 u[IU]/mL — AB (ref 0.450–4.500)

## 2016-06-20 LAB — VITAMIN D 25 HYDROXY (VIT D DEFICIENCY, FRACTURES): Vit D, 25-Hydroxy: 32.9 ng/mL (ref 30.0–100.0)

## 2016-08-14 ENCOUNTER — Other Ambulatory Visit: Payer: BLUE CROSS/BLUE SHIELD

## 2016-09-28 ENCOUNTER — Encounter: Payer: Self-pay | Admitting: Family Medicine

## 2016-09-28 ENCOUNTER — Ambulatory Visit (INDEPENDENT_AMBULATORY_CARE_PROVIDER_SITE_OTHER): Payer: BLUE CROSS/BLUE SHIELD | Admitting: Family Medicine

## 2016-09-28 VITALS — BP 125/84 | HR 69 | Ht 72.0 in | Wt 232.0 lb

## 2016-09-28 DIAGNOSIS — R7989 Other specified abnormal findings of blood chemistry: Secondary | ICD-10-CM | POA: Insufficient documentation

## 2016-09-28 DIAGNOSIS — Z114 Encounter for screening for human immunodeficiency virus [HIV]: Secondary | ICD-10-CM

## 2016-09-28 DIAGNOSIS — K9049 Malabsorption due to intolerance, not elsewhere classified: Secondary | ICD-10-CM | POA: Insufficient documentation

## 2016-09-28 DIAGNOSIS — R748 Abnormal levels of other serum enzymes: Secondary | ICD-10-CM | POA: Diagnosis not present

## 2016-09-28 DIAGNOSIS — K644 Residual hemorrhoidal skin tags: Secondary | ICD-10-CM | POA: Insufficient documentation

## 2016-09-28 DIAGNOSIS — R946 Abnormal results of thyroid function studies: Secondary | ICD-10-CM

## 2016-09-28 DIAGNOSIS — K602 Anal fissure, unspecified: Secondary | ICD-10-CM | POA: Insufficient documentation

## 2016-09-28 DIAGNOSIS — R82998 Other abnormal findings in urine: Secondary | ICD-10-CM | POA: Insufficient documentation

## 2016-09-28 DIAGNOSIS — R8299 Other abnormal findings in urine: Secondary | ICD-10-CM | POA: Diagnosis not present

## 2016-09-28 DIAGNOSIS — K529 Noninfective gastroenteritis and colitis, unspecified: Secondary | ICD-10-CM | POA: Diagnosis not present

## 2016-09-28 DIAGNOSIS — R7401 Elevation of levels of liver transaminase levels: Secondary | ICD-10-CM | POA: Insufficient documentation

## 2016-09-28 LAB — POCT URINALYSIS DIPSTICK
BILIRUBIN UA: NEGATIVE
GLUCOSE UA: NEGATIVE
KETONES UA: NEGATIVE
Leukocytes, UA: NEGATIVE
Nitrite, UA: NEGATIVE
Protein, UA: NEGATIVE
RBC UA: NEGATIVE
SPEC GRAV UA: 1.025 (ref 1.010–1.025)
Urobilinogen, UA: 0.2 E.U./dL
pH, UA: 5.5 (ref 5.0–8.0)

## 2016-09-28 NOTE — Assessment & Plan Note (Signed)
Needs lactose free diet  - lactaid d/c pt   - need to see colonoscopy results and see tissue bx results; may need additional GI eval

## 2016-09-28 NOTE — Progress Notes (Addendum)
Impression and Recommendations:    1. Anal fissure, unspecified   2. External hemorrhoids without complication   3. h/o Urine WBC increased   4. Elevated liver enzymes   5. Low TSH level   6. Screening for HIV (human immunodeficiency virus)   7. Chronic diarrhea   8. Milk intolerance   9. Urine WBC increased     Elevated liver enzymes - Reck today  - get me GI records and labs from UC as they have done full Hep panels etc in past and per pt--> WNL's  External hemorrhoids without complication Since sx free--> not inflammed- monitor for now.    Counseling done regarding disease process and various treatment options.  All questions answered  Handouts given    1 time h/o Urine WBC increased likely not a clean catch last time.    reassured pt - UA WNL's today in office    Anal fissure, unspecified Anoscopy in office today showed very small internal anal fissure that had active tinge of fresh blood today.   - stool hygeine d/c pt- fiber, water, no straining etc  - get me GI records to see if additional tests are warrented  - I have concerns for crohn's    Chronic diarrhea stooling hygeine d/c pt  Milk intolerance Needs lactose free diet  - lactaid d/c pt   - need to see colonoscopy results and see tissue bx results; may need additional GI eval  Low TSH level Completely Asx and free T4 N.    Subclinical hypothyroidism- will cont to monitor for stability  Pt was in the office today for 40+ minutes, with over 50% time spent in face to face counseling of patients various medical conditions as noted above, treatment plans of those medical conditions including medicine management and lifestyle modification, strategies to improve health and well being; and in coordination of care- obtaining med records, talking to other doctors etc.  The patient was counseled, risk factors were discussed, anticipatory guidance given.    Orders Placed This Encounter    Procedures  . TSH  . Hepatic function panel  . HIV antibody  . POCT urinalysis dipstick     Please see AVS handed out to patient at the end of our visit for further patient instructions/ counseling done pertaining to today's office visit.   Return in about 3 months (around 12/29/2016), or f/up sooner than planned if new/ worse sx, for once I get med records from GI/uregent care etc, come in to discuss.     Note: This document was prepared using Dragon voice recognition software and may include unintentional dictation errors.  Dennis Owens 1:10 PM --------------------------------------------------------------------------------------------------------------------------------------------------------------------------------------------------------------------------------------------    Subjective:    CC:  Chief Complaint  Patient presents with  . Hematuria  . Hemorrhoids  . Flatulance    HPI: Dennis Owens is a 31 y.o. male who [facility] for issues as discussed below.  2 yrs or so ago ---> saw GI in [place].    [facility] sent him there for hemorrhoids issues.    Rarely ever has solid stools--> always loose for many yrs.  Had colonoscopy - all normal per pt.  He will get us records as I do not have them.   Chronic h/o Elevated liver enzymes:  Last time he had blood drawn- just got over the flu--> so was on tylenol - which could have caused elevated liver enzymes.  Doesn't  drink really at all.    BUt has h/o them being up and saw GI in the past.   No etoh or tylenol for months now.     Hem- known h/o int and ext--> since age 52.  Most of time only feels is wipes.  Loose stools, urgency of stooling, ever since age 2 or so.   Knows it is from milk and ice cream products.   Never tried lactaid.  Not sure what GI doc dx him with.     When he eliminates milk products from diet--> stools normalize but he only lasts couple days off his milk.    In past- pt had white blood cells in urine couple yrs ago- not RBC's.  Was told to f/up with his PCP about this many yrs ago by UC.  NO other urinary sx     Wt Readings from Last 3 Encounters:  09/28/16 232 lb (105.2 kg)  05/10/16 239 lb 14.4 oz (108.8 kg)  12/11/12 214 lb (97.1 kg)   BP Readings from Last 3 Encounters:  09/28/16 125/84  05/10/16 127/82  10/16/13 142/71   Pulse Readings from Last 3 Encounters:  09/28/16 69  05/10/16 69  10/16/13 80   BMI Readings from Last 3 Encounters:  09/28/16 31.46 kg/m  05/10/16 32.54 kg/m  12/11/12 29.02 kg/m     Patient Care Team    Relationship Specialty Notifications Start End  Thomasene Lot, DO PCP - General Family Medicine  05/10/16   Misenheimer, Marcial Pacas, MD Consulting Physician Unknown Physician Specialty  05/10/16      Patient Active Problem List   Diagnosis Date Noted  . Anal fissure, unspecified 09/28/2016    Priority: High  . Chronic diarrhea 09/28/2016    Priority: High  . Elevated liver enzymes 09/28/2016    Priority: High  . Obesity, Class I, BMI 30-34.9 05/10/2016    Priority: High  . External hemorrhoids without complication 09/28/2016    Priority: Medium  . Milk intolerance 09/28/2016    Priority: Medium  . Family history of diabetes mellitus- MGM 05/10/2016    Priority: Medium  . Low TSH level 09/28/2016    Priority: Low  . 1 time h/o Urine WBC increased 09/28/2016    Past Medical history, Surgical history, Family history, Social history, Allergies and Medications have been entered into the medical record, reviewed and changed as needed.    No outpatient prescriptions have been marked as taking for the 09/28/16 encounter (Office Visit) with Thomasene Lot, DO.    Allergies:  Allergies  Allergen Reactions  . Penicillins Rash     Review of Systems: General:   Denies fever, chills, unexplained weight loss.  Optho/Auditory:   Denies visual changes, blurred vision/LOV Respiratory:    Denies wheeze, DOE more than baseline levels.  Cardiovascular:   Denies chest pain, palpitations, new onset peripheral edema  Gastrointestinal:   Denies nausea, vomiting, diarrhea, abd pain.  Genitourinary: Denies dysuria, freq/ urgency, flank pain or discharge from genitals.  Endocrine:     Denies hot or cold intolerance, polyuria, polydipsia. Musculoskeletal:   Denies unexplained myalgias, joint swelling, unexplained arthralgias, gait problems.  Skin:  Denies new onset rash, suspicious lesions Neurological:     Denies dizziness, unexplained weakness, numbness  Psychiatric/Behavioral:   Denies mood changes, suicidal or homicidal ideations, hallucinations    Objective:   Blood pressure 125/84, pulse 69, height 6' (1.829 m), weight 232 lb (105.2 kg). Body mass index is 31.46 kg/m. General:  Well Developed, well  nourished, appropriate for stated age.  Neuro:  Alert and oriented,  extra-ocular muscles intact  HEENT:  Normocephalic, atraumatic, neck supple, no carotid bruits appreciated  Skin:  no gross rash, warm, pink. Cardiac:  RRR, S1 S2 Respiratory:  ECTA B/L and A/P, Not using accessory muscles, speaking in full sentences- unlabored. Vascular:  Ext warm, no cyanosis apprec.; cap RF less 2 sec. Psych:  No HI/SI, judgement and insight good, Euthymic mood. Full Affect.  Procedures=  Procedure:   -Anoscopy -Performed by Dr. Thomasene Lot  -Consent obtained after discussion of risks benefits and alternatives of procedure; patient wished to proceed. -Noted no overlying erythema, warmth, induration, or other signs of local infection in anal region. - small non-inflamed external hemorrhoid around 3pm position noted, NTTP. NO bleeding/ fissures/fistulas. No rash  - anoscope inserted with lube w/o difficulty.   - small fissure noted- scant amnt fresh blood present overlying fissure.  - + 1 small non-inflamed int hemorrhoid apprec.  - Advised to call if fevers/chills, erythema, drainage,  or bleeding.   Follow-up as discussed

## 2016-09-28 NOTE — Assessment & Plan Note (Addendum)
Anoscopy in office today showed very small internal anal fissure that had active tinge of fresh blood today.   - stool hygeine d/c pt- fiber, water, no straining etc  - get me GI records to see if additional tests are warrented  - I have concerns for crohn's

## 2016-09-28 NOTE — Assessment & Plan Note (Addendum)
Since sx free--> not inflammed- monitor for now.    Counseling done regarding disease process and various treatment options.  All questions answered  Handouts given

## 2016-09-28 NOTE — Patient Instructions (Addendum)
Get me GI records and any others--> [facility] med records etc.    After I get med records - then f/up with me to discuss next steps.      Hemorrhoids Hemorrhoids are swollen veins in and around the rectum or anus. There are two types of hemorrhoids:  Internal hemorrhoids. These occur in the veins that are just inside the rectum. They may poke through to the outside and become irritated and painful.  External hemorrhoids. These occur in the veins that are outside of the anus and can be felt as a painful swelling or hard lump near the anus. Most hemorrhoids do not cause serious problems, and they can be managed with home treatments such as diet and lifestyle changes. If home treatments do not help your symptoms, procedures can be done to shrink or remove the hemorrhoids. What are the causes? This condition is caused by increased pressure in the anal area. This pressure may result from various things, including:  Constipation.  Straining to have a bowel movement.  Diarrhea.  Pregnancy.  Obesity.  Sitting for long periods of time.  Heavy lifting or other activity that causes you to strain.  Anal sex. What are the signs or symptoms? Symptoms of this condition include:  Pain.  Anal itching or irritation.  Rectal bleeding.  Leakage of stool (feces).  Anal swelling.  One or more lumps around the anus. How is this diagnosed? This condition can often be diagnosed through a visual exam. Other exams or tests may also be done, such as:  Examination of the rectal area with a gloved hand (digital rectal exam).  Examination of the anal canal using a small tube (anoscope).  A blood test, if you have lost a significant amount of blood.  A test to look inside the colon (sigmoidoscopy or colonoscopy). How is this treated? This condition can usually be treated at home. However, various procedures may be done if dietary changes, lifestyle changes, and other home treatments do not help  your symptoms. These procedures can help make the hemorrhoids smaller or remove them completely. Some of these procedures involve surgery, and others do not. Common procedures include:  Rubber band ligation. Rubber bands are placed at the base of the hemorrhoids to cut off the blood supply to them.  Sclerotherapy. Medicine is injected into the hemorrhoids to shrink them.  Infrared coagulation. A type of light energy is used to get rid of the hemorrhoids.  Hemorrhoidectomy surgery. The hemorrhoids are surgically removed, and the veins that supply them are tied off.  Stapled hemorrhoidopexy surgery. A circular stapling device is used to remove the hemorrhoids and use staples to cut off the blood supply to them. Follow these instructions at home: Eating and drinking   Eat foods that have a lot of fiber in them, such as whole grains, beans, nuts, fruits, and vegetables. Ask your health care provider about taking products that have added fiber (fiber supplements).  Drink enough fluid to keep your urine clear or pale yellow. Managing pain and swelling   Take warm sitz baths for 20 minutes, 3-4 times a day to ease pain and discomfort.  If directed, apply ice to the affected area. Using ice packs between sitz baths may be helpful.  Put ice in a plastic bag.  Place a towel between your skin and the bag.  Leave the ice on for 20 minutes, 2-3 times a day. General instructions   Take over-the-counter and prescription medicines only as told by your health  care provider.  Use medicated creams or suppositories as told.  Exercise regularly.  Go to the bathroom when you have the urge to have a bowel movement. Do not wait.  Avoid straining to have bowel movements.  Keep the anal area dry and clean. Use wet toilet paper or moist towelettes after a bowel movement.  Do not sit on the toilet for long periods of time. This increases blood pooling and pain. Contact a health care provider  if:  You have increasing pain and swelling that are not controlled by treatment or medicine.  You have uncontrolled bleeding.  You have difficulty having a bowel movement, or you are unable to have a bowel movement.  You have pain or inflammation outside the area of the hemorrhoids. This information is not intended to replace advice given to you by your health care provider. Make sure you discuss any questions you have with your health care provider. Document Released: 05/05/2000 Document Revised: 10/06/2015 Document Reviewed: 01/20/2015 Elsevier Interactive Patient Education  2017 [employer]   Diet for Lactose Intolerance, Adult Lactose intolerance is when the body is not able to digest lactose, a natural sugar found in milk and milk products. If you are lactose intolerant, you should avoid consuming food and drinks with lactose. What do I need to know about this diet?  Avoid consuming foods and beverages with lactose.  Look for the words "lactose-free" or "lactose-reduced" on food labels. You can have lactose-free foods and may be able to have small amounts of lactose-reduced foods.  Make sure you get enough nutrients in your diet. People on this diet sometimes have trouble getting enough calcium, riboflavin, and vitamin D. Take supplements if directed by your health care provider. Talk to your health care provider about supplements if you are not taking any. Which foods have lactose? Lactose is found in milk and milk products, such as:  Yogurt.  Cheese.  Butter.  Margarine.  Sour cream.  Creamer.  Whipped toppings and nondairy creamers.  Ice cream and other milk-based desserts. Lactose is also found in foods made with milk or milk ingredients. To find out whether a food is made with milk or a milk ingredient, look at the ingredients list. Avoid foods with the statement "May contain milk" and foods that contain:  Butter.  Cream.  Milk.  Milk solids.  Milk  powder.  Whey.  Curd.  Caseinate.  Lactose. What are some alternatives to milk and foods made with milk products?  Lactose-free products, such as lactose-free milk.  Almond or rice milk.  Soy products, such as soy yogurt, soy cheese, soy ice cream, soy-based sour cream, and soy-based infant formula.  Nondairy products, such as nondairy creamers and nondairy whipped topping. Note that nondairy products sometimes contain lactose, so it is important to check the ingredients list. Can I have any foods with lactose? Some people with lactose intolerance can safely eat foods that have a little lactose. Foods with a little lactose have less than 1 g of lactose per serving. Examples of foods with a little lactose are:  Aged cheese (such as Swiss, cheddar, or Parmesan cheese). One serving is about 1-2 oz.  Cream cheese. One serving is about 2 Tbsp.  Ricotta cheese. One serving is about  cup. If you decide to try a food that has lactose:  Eat only one food with lactose in it at a time.  Eat only a small amount of the food.  Stop eating the food if your symptoms  return. Some dairy products that are more likely than others to be tolerated include:  Cheese, especially if it is aged.  Cultured dairy products, such as yogurt, buttermilk, cottage cheese, and kefir. The healthy bacteria in these products help digest lactose.  Lactose-hydrolyzed milk. This product contains 40-90% less lactose than milk. Am I getting enough calcium? Calcium is found in many foods with lactose and is important for bone health. The amount of calcium you need depends on your age:  Adults younger than 50 years need 1000 mg of calcium a day.  Adults older than 50 years need 1200 mg of calcium a day. Make sure you get enough calcium by taking a calcium supplement or by eating lactose-free foods that are high in calcium, such as:  Almonds,  cup (95 mg).  Broccoli, cooked, 1 cup (60 mg).  Calcium-fortified  breakfast cereals, 1 cup [phone] mg).  Calcium-fortified rice or almond milk, 1 cup (300 mg).  Calcium-fortified soy milk, 1 cup (300-400 mg).  Canned salmon with edible bones, 3 oz (180 mg).  Collard greens, cooked,  cup (125 mg).  Edamame, cooked,  cup (125 mg).  Kale, frozen or cooked,  cup (90 mg).  Orange juice with calcium added, 1 cup (300-350 mg).  Sardines with edible bones, 3 oz (325 mg).  Spinach, cooked,  cup (145 mg).  Tofu set with calcium sulfate,  cup (250 mg). This information is not intended to replace advice given to you by your health care provider. Make sure you discuss any questions you have with your health care provider. Document Released: 12/03/2013 Document Revised: 10/14/2015 Document Reviewed: 07/11/2013 Elsevier Interactive Patient Education  2017 [employer].

## 2016-09-28 NOTE — Assessment & Plan Note (Signed)
Completely Asx and free T4 N.    Subclinical hypothyroidism- will cont to monitor for stability

## 2016-09-28 NOTE — Assessment & Plan Note (Addendum)
likely not a clean catch last time.    reassured pt - UA WNL's today in office

## 2016-09-28 NOTE — Assessment & Plan Note (Signed)
stooling hygeine d/c pt

## 2016-09-28 NOTE — Assessment & Plan Note (Addendum)
-   Reck today  - get me GI records and labs from UC as they have done full Hep panels etc in past and per pt--> WNL's

## 2016-09-29 LAB — HEPATIC FUNCTION PANEL
ALK PHOS: 74 IU/L (ref 39–117)
ALT: 115 IU/L — ABNORMAL HIGH (ref 0–44)
AST: 71 IU/L — ABNORMAL HIGH (ref 0–40)
Albumin: 4.7 g/dL (ref 3.5–5.5)
Bilirubin Total: 0.7 mg/dL (ref 0.0–1.2)
Bilirubin, Direct: 0.2 mg/dL (ref 0.00–0.40)
TOTAL PROTEIN: 7.6 g/dL (ref 6.0–8.5)

## 2016-09-29 LAB — HIV ANTIBODY (ROUTINE TESTING W REFLEX): HIV SCREEN 4TH GENERATION: NONREACTIVE

## 2016-09-29 LAB — TSH: TSH: 0.534 u[IU]/mL (ref 0.450–4.500)

## 2016-12-29 ENCOUNTER — Encounter: Payer: Self-pay | Admitting: Gastroenterology

## 2016-12-29 ENCOUNTER — Encounter: Payer: Self-pay | Admitting: Family Medicine

## 2016-12-29 ENCOUNTER — Ambulatory Visit (INDEPENDENT_AMBULATORY_CARE_PROVIDER_SITE_OTHER): Payer: BLUE CROSS/BLUE SHIELD | Admitting: Family Medicine

## 2016-12-29 VITALS — BP 132/82 | HR 80 | Ht 72.0 in | Wt 238.9 lb

## 2016-12-29 DIAGNOSIS — K9049 Malabsorption due to intolerance, not elsewhere classified: Secondary | ICD-10-CM | POA: Diagnosis not present

## 2016-12-29 DIAGNOSIS — K529 Noninfective gastroenteritis and colitis, unspecified: Secondary | ICD-10-CM

## 2016-12-29 DIAGNOSIS — K602 Anal fissure, unspecified: Secondary | ICD-10-CM | POA: Diagnosis not present

## 2016-12-29 DIAGNOSIS — R748 Abnormal levels of other serum enzymes: Secondary | ICD-10-CM

## 2016-12-29 DIAGNOSIS — Z9889 Other specified postprocedural states: Secondary | ICD-10-CM | POA: Insufficient documentation

## 2016-12-29 DIAGNOSIS — K644 Residual hemorrhoidal skin tags: Secondary | ICD-10-CM

## 2016-12-29 MED ORDER — HYDROCORTISONE ACETATE 25 MG RE SUPP: 25.0000 mg | Freq: Two times a day (BID) | RECTAL | 2 refills | Status: DC | PRN

## 2016-12-29 NOTE — Progress Notes (Signed)
Impression and Recommendations:    1. External hemorrhoids without complication   2. Milk intolerance   3. Elevated liver enzymes   4. Anal fissure, unspecified   5. Chronic diarrhea   6. History of colonoscopy    -After discussion patient prefers to be seen and treated by a gastroenterologist through [facility].  Prior had been in [place] and I still do not have those records. -Advised to stay away from all milk products.  Can try Lactaid as needed. -Hemorrhoids patient can use over-the-counter hemorrhoidal creams which he is not doing.  Regular usage, stool hygiene discussed, no straining, keep stools looser etc. -Definitive management per gastroenterology. -I suspect his GI symptoms due to IBS as well and we discussed going to a counselor for his stress.  Handouts with list of providers in the area given to patient.  He will consider it. The patient was counseled, risk factors were discussed, anticipatory guidance given.  Orders Placed This Encounter  Procedures  . Ambulatory referral to Gastroenterology    Gross side effects, risk and benefits, and alternatives of medications and treatment plan in general discussed with patient.  Patient is aware that all medications have potential side effects and we are unable to predict every side effect or drug-drug interaction that may occur.   Patient will call with any questions prior to using medication if they have concerns.  Expresses verbal understanding and consents to current therapy and treatment regimen.  No barriers to understanding were identified.  Red flag symptoms and signs discussed in detail.  Patient expressed understanding regarding what to do in case of emergency\urgent symptoms  Please see AVS handed out to patient at the end of our visit for further patient instructions/ counseling done pertaining to today's office visit.   Return in about 6 months (around 07/01/2017) for F-up of current med issues as previously d/c pt.     Note: This document was prepared using Dragon voice recognition software and may include unintentional dictation errors.  Yanissa Michalsky 7:01 PM --------------------------------------------------------------------------------------------------------------------------------------------------------------------------------------------------------------------------------------------    Subjective:    CC:  Chief Complaint  Patient presents with  . Follow-up    hemorrhoids    HPI: Dennis Owens is a 31 y.o. male who presents to [facility] today for issues as discussed below.  Patient here for follow-up on his anal fissure and hemorrhoids.  We're waiting to get his GI results and reports from his prior physician but we do not have them. (  2 yrs or so ago ---> saw GI in [place].    [facility] sent him there for hemorrhoids issues.)   -     Rarely ever has solid stools--> always loose for many yrs.  Had colonoscopy - all normal per pt.  He will get [place] records as I still do not have them.   Chronic h/o Elevated liver enzymes:  His prior GI doc r/o Hepatitis per pt with labs- all neg.  But has long h/o LFT's being up and saw GI in the past.   No etoh or tylenol for months now.  No abd sx other than stated here    Hem- known h/o int and ext--> since age 47.  About once a month he's having episodes of intense pain with bleeding into the commode which are due to his ext hemorrhoids.  Most of time prior- only feels them as discomfort if wipes.  Loose stools, urgency of stooling, ever since age  26 or so.   Knows it is from milk and ice cream products.   Never tried lactaid.  Not sure what GI doc dx him with.     When he eliminates milk products from diet--> stools normalize but he only lasts couple days off his milk.        Wt Readings from Last 3 Encounters:  05/25/17 246 lb 4 oz (111.7 kg)  12/29/16 238 lb 14.4 oz (108.4 kg)  09/28/16 232 lb (105.2 kg)    BP Readings from Last 3 Encounters:  05/25/17 118/64  12/29/16 132/82  09/28/16 125/84   Pulse Readings from Last 3 Encounters:  05/25/17 80  12/29/16 80  09/28/16 69   BMI Readings from Last 3 Encounters:  05/25/17 33.87 kg/m  12/29/16 32.40 kg/m  09/28/16 31.46 kg/m     Patient Care Team    Relationship Specialty Notifications Start End  Thomasene Lotpalski, Krisi Azua, DO PCP - General Family Medicine  05/10/16   Misenheimer, Marcial Pacasimothy, MD Consulting Physician Unknown Physician Specialty  05/10/16      Patient Active Problem List   Diagnosis Date Noted  . Anal fissure, unspecified 09/28/2016    Priority: High  . Chronic diarrhea 09/28/2016    Priority: High  . Elevated liver enzymes 09/28/2016    Priority: High  . Obesity, Class I, BMI 30-34.9 05/10/2016    Priority: High  . External hemorrhoids without complication 09/28/2016    Priority: Medium  . Milk intolerance 09/28/2016    Priority: Medium  . Family history of diabetes mellitus- MGM 05/10/2016    Priority: Medium  . Low TSH level 09/28/2016    Priority: Low  . History of colonoscopy 12/29/2016  . 1 time h/o Urine WBC increased 09/28/2016    Past Medical history, Surgical history, Family history, Social history, Allergies and Medications have been entered into the medical record, reviewed and changed as needed.    No outpatient medications have been marked as taking for the 12/29/16 encounter (Office Visit) with Thomasene Lotpalski, Lyndsy Gilberto, DO.    Allergies:  Allergies  Allergen Reactions  . Penicillins Rash     Review of Systems: General:   Denies fever, chills, unexplained weight loss.  Optho/Auditory:   Denies visual changes, blurred vision/LOV Respiratory:   Denies wheeze, DOE more than baseline levels.  Cardiovascular:   Denies chest pain, palpitations, new onset peripheral edema  Gastrointestinal:   Denies nausea, vomiting, diarrhea, abd pain.  Genitourinary: Denies dysuria, freq/ urgency, flank pain or  discharge from genitals.  Endocrine:     Denies hot or cold intolerance, polyuria, polydipsia. Musculoskeletal:   Denies unexplained myalgias, joint swelling, unexplained arthralgias, gait problems.  Skin:  Denies new onset rash, suspicious lesions Neurological:     Denies dizziness, unexplained weakness, numbness  Psychiatric/Behavioral:   Denies mood changes, suicidal or homicidal ideations, hallucinations    Objective:   Blood pressure 132/82, pulse 80, height 6' (1.829 m), weight 238 lb 14.4 oz (108.4 kg). Body mass index is 32.4 kg/m. General:  Well Developed, well nourished, appropriate for stated age.  Neuro:  Alert and oriented,  extra-ocular muscles intact  HEENT:  Normocephalic, atraumatic, neck supple, no carotid bruits appreciated  Skin:  no gross rash, warm, pink. Cardiac:  RRR, S1 S2 Respiratory:  ECTA B/L and A/P, Not using accessory muscles, speaking in full sentences- unlabored. Vascular:  Ext warm, no cyanosis apprec.; cap RF less 2 sec. Psych:  No HI/SI, judgement and insight good, Euthymic mood. Full Affect.

## 2016-12-29 NOTE — Patient Instructions (Signed)
-   Referral to gastroenterology to help him deal with his multiple gastroenterologic issues      Behavioral Health/Psychiatry Referrals   Bethann BerkshireScott Mamula [email or web address] [employer]- gen counseling;  PH-D   Corine Shelteresa McKinney, MSW [address]  [place] [phone]   [facility] Caralyn Guileavid Gutterman, PhD [address], [facility] [place] [phone]  [facility] [address], [address] [place], [place] [phone]  Heloise BeechamAlicia Brown-Licensed Professional Counselor Counseling and [employer] [facility] [employer] [phone]  [facility] MacKenzie-Substance abuse [facility]wn Taylor-Clinical Manager [address], [place] [phone]  [facility] [address], [place] [phone] Eliott NineMichie Dew, PhD [place] ScrapeStuart Hunt, PhD Hollace Kinnierlair Huprich, LCSW Andrena MewsJennifer Sommer, PhD-child, adolescent and adults  [facility] [address], [place] [phone] Daun PeacockSara DeHart-Cherubin, MS-child, adolescent and adults Madelaine EtienneKatherine Glenn, PhD-adolescent and adults  [facility], terminal illness [address], [place] [phone]  [facility] [address], [place] [phone] [facility] [address] [place] [phone]  [facility] [address], [place] [phone]  [facility] [address], [address], [place] [phone]  [facility] [address], [place] [phone]  [facility] [address], [address], [place] [phone]  [facility] [address], [address], [place] [phone]

## 2017-02-26 ENCOUNTER — Ambulatory Visit: Payer: BLUE CROSS/BLUE SHIELD | Admitting: Gastroenterology

## 2017-03-02 ENCOUNTER — Encounter: Payer: Self-pay | Admitting: Family Medicine

## 2017-05-25 ENCOUNTER — Ambulatory Visit: Payer: BLUE CROSS/BLUE SHIELD | Admitting: Gastroenterology

## 2017-05-25 ENCOUNTER — Other Ambulatory Visit (INDEPENDENT_AMBULATORY_CARE_PROVIDER_SITE_OTHER): Payer: BLUE CROSS/BLUE SHIELD

## 2017-05-25 ENCOUNTER — Encounter: Payer: Self-pay | Admitting: Gastroenterology

## 2017-05-25 VITALS — BP 118/64 | HR 80 | Ht 71.5 in | Wt 246.2 lb

## 2017-05-25 DIAGNOSIS — R7989 Other specified abnormal findings of blood chemistry: Secondary | ICD-10-CM

## 2017-05-25 DIAGNOSIS — R1030 Lower abdominal pain, unspecified: Secondary | ICD-10-CM | POA: Diagnosis not present

## 2017-05-25 DIAGNOSIS — R197 Diarrhea, unspecified: Secondary | ICD-10-CM

## 2017-05-25 DIAGNOSIS — R945 Abnormal results of liver function studies: Secondary | ICD-10-CM

## 2017-05-25 LAB — IGA: IGA: 137 mg/dL (ref 68–378)

## 2017-05-25 MED ORDER — DICYCLOMINE HCL 10 MG PO CAPS: 10.0000 mg | ORAL_CAPSULE | Freq: Three times a day (TID) | ORAL | 11 refills | Status: DC

## 2017-05-25 NOTE — Patient Instructions (Addendum)
Your physician has requested that you go to the basement for lab work before leaving today.  We have sent the following medications to your pharmacy for you to pick up at your convenience: bentyl.  You can use Lactaid tablets 30 minutes before meals and preparation H cream and suppositories twice daily when having hemorrhoid symptoms.   Try a lactose free diet or reduce your lactose in your diet.  You have been scheduled for an abdominal [facility] (1st floor of hospital) on 05/31/17 at 9:00am. Please arrive 15 minutes prior to your appointment for registration. Make certain not to have anything to eat or drink 6 hours prior to your appointment. Should you need to reschedule your appointment, please contact radiology at [phone]. This test typically takes about 30 minutes to perform.  Normal BMI (Body Mass Index- based on height and weight) is between 19 and 25. Your BMI today is Body mass index is 33.87 kg/m. Marland Kitchen. Please consider follow up  regarding your BMI with your Primary Care Provider.  Thank you for choosing me and [facility].  Venita LickMalcolm T. Pleas KochStark, Jr., MD., Clementeen GrahamFACG

## 2017-05-25 NOTE — Progress Notes (Signed)
History of Present Illness: This is a 31 year referred by Thomasene Lotpalski, Deborah, DO for the evaluation of elevated LFTs, hemorrhoids symptoms and postprandial diarrhea.  AST and ALT have been elevated in the 2-3 times normal range documented since 2016.  Acute hepatitis panel in June 2016 was negative including a negative Hb surface Ag, negative HCV Ab, negative HBc Ab and negative HAV IgM.  He relates diarrhea following meals for many years.  He notes lower abdominal bloating and cramping prior to urgent diarrhea.  Known lactose intolerance however he relates he cannot stop all lactose especially cheese and other foods clearly lead to diarrhea.  He has not tried Lactaid products or Lactaid pills.  He has had intermittent hemorrhoid symptoms such as swelling and minor rectal bleeding however not active in the past several months. Colonoscopy to Dr. Jennye BoroughsMisenheimer in 09/2013 performed for heme + stool showed internal hemorrhoids and anal canal inflammation. Denies weight loss, constipation, change in stool caliber, melena, hematochezia, nausea, vomiting, dysphagia, reflux symptoms, chest pain.   Allergies  Allergen Reactions  . Penicillins Rash   Outpatient Medications Prior to Visit  Medication Sig Dispense Refill  . hydrocortisone (ANUSOL-HC) 25 MG suppository Place 1 suppository (25 mg total) rectally 2 (two) times daily as needed for hemorrhoids or anal itching. (Patient not taking: Reported on 05/25/2017) 24 suppository 2   No facility-administered medications prior to visit.    Past Medical History:  Diagnosis Date  . Anal fissure   . Internal hemorrhoids   . Milk intolerance    Past Surgical History:  Procedure Laterality Date  . WISDOM TOOTH EXTRACTION     Social History   Socioeconomic History  . Marital status: Married    Spouse name: None  . Number of children: 2  . Years of education: None  . Highest education level: None  Social Needs  . Financial resource strain: None  . Food  insecurity - worry: None  . Food insecurity - inability: None  . Transportation needs - medical: None  . Transportation needs - non-medical: None  Occupational History  . None  Tobacco Use  . Smoking status: Never Smoker  . Smokeless tobacco: Never Used  Substance and Sexual Activity  . Alcohol use: Yes    Comment: occassionally  . Drug use: No  . Sexual activity: Yes    Birth control/protection: Condom  Other Topics Concern  . None  Social History Narrative  . None   Family History  Problem Relation Age of Onset  . Healthy Mother   . Healthy Father   . Healthy Sister   . Healthy Brother   . Healthy Daughter   . Healthy Son   . Diabetes Maternal Grandmother   . Healthy Brother   . Colon cancer Neg Hx   . Stomach cancer Neg Hx   . Rectal cancer Neg Hx   . Esophageal cancer Neg Hx   . Liver cancer Neg Hx       Review of Systems: Pertinent positive and negative review of systems were noted in the above HPI section. All other review of systems were otherwise negative.   Physical Exam: General: Well developed, well nourished, no acute distress Head: Normocephalic and atraumatic Eyes:  sclerae anicteric, EOMI Ears: Normal auditory acuity Mouth: No deformity or lesions Neck: Supple, no masses or thyromegaly Lungs: Clear throughout to auscultation Heart: Regular rate and rhythm; no murmurs, rubs or bruits Abdomen: Soft, non tender and non distended. No masses, hepatosplenomegaly or  hernias noted. Normal Bowel sounds Rectal: not done Musculoskeletal: Symmetrical with no gross deformities  Skin: No lesions on visible extremities Pulses:  Normal pulses noted Extremities: No clubbing, cyanosis, edema or deformities noted Neurological: Alert oriented x 4, grossly nonfocal Cervical Nodes:  No significant cervical adenopathy Inguinal Nodes: No significant inguinal adenopathy Psychological:  Alert and cooperative. Normal mood and affect  Assessment and  Recommendations:  1. Chronically elevated LFTs.  Rule out hepatic steatosis other chronic liver diseases. Hep C and Hep B excluded in 2016.  Standard hepatic serologies.  Schedule abdominal ultrasound. REV in 1 month.  2. Chronic postprandial diarrhea with urgency and lower abdominal crampy pain that is relieved with bowel movement.  IgA and tTG.  Liquid stool for GI pathogen panel. Advised a completely lactose free diet for 7 days he does not feel he can do this.  Begin Lactaid pills as directed before all meals containing lactose along with a low lactose diet.  Begin dicyclomine 10 mg 3 times daily before meals. REV in 1 month.   3.  Intermittent hemorrhoid symptoms with internal hemorrhoids documented on colonoscopy.  Preparation H cream or suppositories twice daily as needed for hemorrhoid flares.  If hemorrhoids flaring frequently return for further follow-up.   cc: Thomasene Lot, DO [address] [place], [place] [address]

## 2017-05-29 LAB — ANA: ANA: NEGATIVE

## 2017-05-29 LAB — ANTI-SMOOTH MUSCLE ANTIBODY, IGG: Actin (Smooth Muscle) Antibody (IGG): <20 U (ref <20)

## 2017-05-29 LAB — ALPHA-1-ANTITRYPSIN: A-1 Antitrypsin, Ser: 116 mg/dL (ref 83–199)

## 2017-05-29 LAB — TISSUE TRANSGLUTAMINASE, IGA: (tTG) Ab, IgA: 1 U/mL

## 2017-05-29 LAB — CERULOPLASMIN: Ceruloplasmin: 23 mg/dL (ref 18–36)

## 2017-05-29 LAB — MITOCHONDRIAL ANTIBODIES: Mitochondrial M2 Ab, IgG: <=20 U

## 2017-05-31 ENCOUNTER — Ambulatory Visit (HOSPITAL_COMMUNITY)
Admission: RE | Admit: 2017-05-31 | Discharge: 2017-05-31 | Disposition: A | Discharge status: Home / Self Care | Payer: BLUE CROSS/BLUE SHIELD | Source: Ambulatory Visit | Attending: Gastroenterology | Admitting: Gastroenterology

## 2017-05-31 DIAGNOSIS — R7989 Other specified abnormal findings of blood chemistry: Secondary | ICD-10-CM

## 2017-05-31 DIAGNOSIS — R945 Abnormal results of liver function studies: Secondary | ICD-10-CM | POA: Diagnosis not present

## 2017-06-19 ENCOUNTER — Telehealth: Payer: Self-pay | Admitting: Family Medicine

## 2017-06-19 NOTE — Telephone Encounter (Signed)
Patient brought forms by on Friday, 06/15/17 ask that provider complete--advise someone would call to let him know to come & pick up. --glh

## 2017-06-20 ENCOUNTER — Ambulatory Visit: Payer: BLUE CROSS/BLUE SHIELD | Admitting: Family Medicine

## 2017-06-20 NOTE — Telephone Encounter (Signed)
Patient was notified that he will need an office visit per Dr. Sharee Holsterpalski - appointment made for today at 11am. MPulliam, CMA/RT(R)

## 2017-07-02 ENCOUNTER — Ambulatory Visit: Payer: BLUE CROSS/BLUE SHIELD | Admitting: Gastroenterology

## 2017-07-06 ENCOUNTER — Encounter: Payer: Self-pay | Admitting: Family Medicine

## 2017-07-06 ENCOUNTER — Ambulatory Visit (INDEPENDENT_AMBULATORY_CARE_PROVIDER_SITE_OTHER): Payer: BLUE CROSS/BLUE SHIELD | Admitting: Family Medicine

## 2017-07-06 VITALS — BP 114/82 | HR 66 | Ht 71.5 in | Wt 243.5 lb

## 2017-07-06 DIAGNOSIS — K529 Noninfective gastroenteritis and colitis, unspecified: Secondary | ICD-10-CM | POA: Diagnosis not present

## 2017-07-06 DIAGNOSIS — K644 Residual hemorrhoidal skin tags: Secondary | ICD-10-CM

## 2017-07-06 DIAGNOSIS — R748 Abnormal levels of other serum enzymes: Secondary | ICD-10-CM | POA: Diagnosis not present

## 2017-07-06 DIAGNOSIS — L738 Other specified follicular disorders: Secondary | ICD-10-CM | POA: Diagnosis not present

## 2017-07-06 DIAGNOSIS — K9049 Malabsorption due to intolerance, not elsewhere classified: Secondary | ICD-10-CM

## 2017-07-06 NOTE — Patient Instructions (Addendum)
-Use OTC ointments and after shaving lotions like Lubriderm, Nivea, etc. post-shave lotion to use after shaving.  -Use OTC Eucerin sensitive skin facial cleanse to clean your face before shaving, at least once a day.  -Use lactaid pills if you know you will eat dairy-rich foods   Folliculitis Folliculitis is inflammation of the hair follicles. Folliculitis most commonly occurs on the scalp, thighs, legs, back, and buttocks. However, it can occur anywhere on the body. What are the causes? This condition may be caused by:  A bacterial infection (common).  A fungal infection.  A viral infection.  Coming into contact with certain chemicals, especially oils and tars.  Shaving or waxing.  Applying greasy ointments or creams to your skin often.  Long-lasting folliculitis and folliculitis that keeps coming back can be caused by bacteria that live in the nostrils. What increases the risk? This condition is more likely to develop in people with:  A weakened immune system.  Diabetes.  Obesity.  What are the signs or symptoms? Symptoms of this condition include:  Redness.  Soreness.  Swelling.  Itching.  Small white or yellow, pus-filled, itchy spots (pustules) that appear over a reddened area. If there is an infection that goes deep into the follicle, these may develop into a boil (furuncle).  A group of closely packed boils (carbuncle). These tend to form in hairy, sweaty areas of the body.  How is this diagnosed? This condition is diagnosed with a skin exam. To find what is causing the condition, your health care provider may take a sample of one of the pustules or boils for testing. How is this treated? This condition may be treated by:  Applying warm compresses to the affected areas.  Taking an antibiotic medicine or applying an antibiotic medicine to the skin.  Applying or bathing with an antiseptic solution.  Taking an over-the-counter medicine to help with  itching.  Having a procedure to drain any pustules or boils. This may be done if a pustule or boil contains a lot of pus or fluid.  Laser hair removal. This may be done to treat long-lasting folliculitis.  Follow these instructions at home:  If directed, apply heat to the affected area as often as told by your health care provider. Use the heat source that your health care provider recommends, such as a moist heat pack or a heating pad. ? Place a towel between your skin and the heat source. ? Leave the heat on for 20-30 minutes. ? Remove the heat if your skin turns bright red. This is especially important if you are unable to feel pain, heat, or cold. You may have a greater risk of getting burned.  If you were prescribed an antibiotic medicine, use it as told by your health care provider. Do not stop using the antibiotic even if you start to feel better.  Take over-the-counter and prescription medicines only as told by your health care provider.  Do not shave irritated skin.  Keep all follow-up visits as told by your health care provider. This is important. Get help right away if:  You have more redness, swelling, or pain in the affected area.  Red streaks are spreading from the affected area.  You have a fever. This information is not intended to replace advice given to you by your health care provider. Make sure you discuss any questions you have with your health care provider. Document Released: 07/17/2001 Document Revised: 11/26/2015 Document Reviewed: 02/26/2015 Elsevier Interactive Patient Education  2018  [employer] ° °

## 2017-07-06 NOTE — Progress Notes (Signed)
Impression and Recommendations:    1. Folliculitis barbae   2. Chronic diarrhea   3. Milk intolerance   4. Elevated liver enzymes   5. External hemorrhoids without complication     1. 1. Folliculitis barbae/ dermatitis - discussed OTC ointments and shaving lotions like Lubriderm Men's post-shave lotion to use after shaving. Recommended using coconut oil to apply to the area after shaving. Wash your face regularly with OTC facial cleansers like Eucerin sensitive skin.   -Use a different wash rag for your face when washing your face.  2. Chronic diarrhea - continue a low lactose and high fiber diet.  Doing qwell, sx stable,   3. Milk intolerance- Continue a low lactose diet. Use lactaid pills if you know you are eating dairy-rich foods.  Switch to dairy free/  Soy milk or almond milk products.   Avoid ice cream/ heavy dairy  4. Elevated liver enzymes- Hep panel negative, workup from GI negative thus far, US showed steatosis vs fibrosis.  He has follow up with GI in near future as instructed.  5. External hemorrhoids without complication- use prep H or suppositories as- needed for symptoms.  Sx stable, no complaints  -Follow up in 6 months for management of chronic care.  Gross side effects, risk and benefits, and alternatives of medications and treatment plan in general discussed with patient.  Patient is aware that all medications have potential side effects and we are unable to predict every side effect or drug-drug interaction that may occur.   Patient will call with any questions prior to using medication if they have concerns.  Expresses verbal understanding and consents to current therapy and treatment regimen.  No barriers to understanding were identified.  Red flag symptoms and signs discussed in detail.  Patient expressed understanding regarding what to do in case of emergency\urgent symptoms  Please see AVS handed out to patient at the end of our visit for further patient  instructions/ counseling done pertaining to today's office visit.   Return in about 6 months (around 01/03/2018) for chronic conditions.    Note: This note was prepared with assistance of Dragon voice recognition software. Occasional wrong-word or sound-a-like substitutions may have occurred due to the inherent limitations of voice recognition software.   This document serves as a record of services personally performed by Thomasene Lot, DO. It was created on her behalf by Thelma Barge, a trained medical scribe. The creation of this record is based on the scribe's personal observations and the provider's statements to them.    --------------------------------------------------------------------------------------------------------------------------------------------------------------------------------------------------------------------------------------------    Subjective:     HPI: Dennis Owens is a 32 y.o. male who presents to [facility] today for issues as discussed below.  From last OV 12-29-16: we evaluated him for external hemorrhoids and lower abdominal cramping. We sent him to GI, who ordered an [place] and discovered fat around his liver. He was also told to reduce lactose intake.  He was supposed to follow up but had to reschedule until 07-13-17.  Abdominal pain: He has been eating more fiber recently and his abdominal pain has resolved. He has been taking lactaid pills to eat before dairy. He has been drinking almond milk instead of dairy milk. He stopped eating as much dairy as well. He is now having one BM a day and it is normal and solid. He denies urgency, diarrhea, cramping, bloating, and post prandial BM's.   Rectal:  He denies  hemorrhoids, rectal bleeding, rectal pain, and itching.  Skin: He also complains of pain around his neck/face after shaving that itches, is irritated, and burns. He shaves once a week for work. He uses a cold rag to  relieve his symptoms. He uses sensitive skin gel for his face during shaving. He changes his razor once a month (4 uses) and uses a [employer] razor. He does not wash his face regularly. He uses Old Spice body wash.   Wt Readings from Last 3 Encounters:  07/06/17 243 lb 8 oz (110.5 kg)  05/25/17 246 lb 4 oz (111.7 kg)  12/29/16 238 lb 14.4 oz (108.4 kg)   BP Readings from Last 3 Encounters:  07/06/17 114/82  05/25/17 118/64  12/29/16 132/82   Pulse Readings from Last 3 Encounters:  07/06/17 66  05/25/17 80  12/29/16 80   BMI Readings from Last 3 Encounters:  07/06/17 33.49 kg/m  05/25/17 33.87 kg/m  12/29/16 32.40 kg/m     Patient Care Team    Relationship Specialty Notifications Start End  Thomasene Lotpalski, Kiran Lapine, DO PCP - General Family Medicine  05/10/16   Misenheimer, Marcial Pacasimothy, MD Consulting Physician Unknown Physician Specialty  05/10/16      Patient Active Problem List   Diagnosis Date Noted  . Anal fissure, unspecified 09/28/2016    Priority: High  . Chronic diarrhea 09/28/2016    Priority: High  . Elevated liver enzymes 09/28/2016    Priority: High  . Obesity, Class I, BMI 30-34.9 05/10/2016    Priority: High  . External hemorrhoids without complication 09/28/2016    Priority: Medium  . Milk intolerance 09/28/2016    Priority: Medium  . Family history of diabetes mellitus- MGM 05/10/2016    Priority: Medium  . Low TSH level 09/28/2016    Priority: Low  . Folliculitis barbae 07/06/2017  . History of colonoscopy 12/29/2016  . 1 time h/o Urine WBC increased 09/28/2016    Past Medical history, Surgical history, Family history, Social history, Allergies and Medications have been entered into the medical record, reviewed and changed as needed.    No outpatient medications have been marked as taking for the 07/06/17 encounter (Office Visit) with Thomasene Lotpalski, Eustace Hur, DO.    Allergies:  Allergies  Allergen Reactions  . Penicillins Rash     Review of Systems:    A fourteen system review of systems was performed and found to be positive as per HPI.   Objective:   Blood pressure 114/82, pulse 66, height 5' 11.5" (1.816 m), weight 243 lb 8 oz (110.5 kg), SpO2 97 %. Body mass index is 33.49 kg/m. General:  Well Developed, well nourished, appropriate for stated age.  Neuro:  Alert and oriented,  extra-ocular muscles intact  HEENT:  Normocephalic, atraumatic, neck supple, no carotid bruits appreciated  Skin:  no gross rash, warm, pink. Cardiac:  RRR, S1 S2 Respiratory:  ECTA B/L and A/P, Not using accessory muscles, speaking in full sentences- unlabored. Vascular:  Ext warm, no cyanosis apprec.; cap RF less 2 sec. Psych:  No HI/SI, judgement and insight good, Euthymic mood. Full Affect.

## 2017-08-10 ENCOUNTER — Ambulatory Visit: Payer: BLUE CROSS/BLUE SHIELD | Admitting: Gastroenterology

## 2018-01-04 ENCOUNTER — Ambulatory Visit: Payer: BLUE CROSS/BLUE SHIELD | Admitting: Family Medicine

## 2020-09-08 ENCOUNTER — Telehealth: Payer: Self-pay | Admitting: Physician Assistant

## 2020-09-08 NOTE — Telephone Encounter (Signed)
Patient has congestion and a sore throat and his at home covid test was negative. Please advise, thanks.

## 2020-09-08 NOTE — Telephone Encounter (Signed)
Pt has not been seen in our office since 2019 and is considered an new patient. I am unable to give any medical advise. Please advise patient. AS, CMA

## 2020-09-08 NOTE — Telephone Encounter (Signed)
Patient was advised to go to Urgent Care based on his symptoms. He has not been seen since 2019 and will be reestablishing care as a new patient May 9 th.

## 2020-09-27 ENCOUNTER — Ambulatory Visit: Payer: BLUE CROSS/BLUE SHIELD | Admitting: Nurse Practitioner

## 2020-09-27 ENCOUNTER — Telehealth: Payer: Self-pay | Admitting: Physician Assistant

## 2020-09-27 NOTE — Telephone Encounter (Signed)
Unable to place referral until patient is seen since he has not been in our office since 2019.  Please speak with [name] about a possible work in? Or contact patient if someone else cancels their apt.

## 2020-09-27 NOTE — Telephone Encounter (Signed)
Patient needs a referral for a GI specialist. He was supposed to be seen as a new patient today but had to be rescheduled since Herbert Seta had to leave. Please advise. Patient also would like to know if he could been see possibly Thursday or later in the week as a new patient. He is scheduled for later in May. Thanks

## 2020-09-28 NOTE — Telephone Encounter (Signed)
Patient contacted and made aware and scheduled for tomorrow. There was a cancellation.

## 2020-09-29 ENCOUNTER — Encounter: Payer: Self-pay | Admitting: Nurse Practitioner

## 2020-09-29 ENCOUNTER — Ambulatory Visit (INDEPENDENT_AMBULATORY_CARE_PROVIDER_SITE_OTHER): Payer: BC Managed Care – PPO | Admitting: Nurse Practitioner

## 2020-09-29 ENCOUNTER — Other Ambulatory Visit: Payer: Self-pay

## 2020-09-29 VITALS — BP 125/69 | HR 64 | Temp 98.0°F | Ht 70.0 in | Wt 226.0 lb

## 2020-09-29 DIAGNOSIS — Z7689 Persons encountering health services in other specified circumstances: Secondary | ICD-10-CM | POA: Diagnosis not present

## 2020-09-29 DIAGNOSIS — M25511 Pain in right shoulder: Secondary | ICD-10-CM

## 2020-09-29 DIAGNOSIS — K644 Residual hemorrhoidal skin tags: Secondary | ICD-10-CM | POA: Diagnosis not present

## 2020-09-29 DIAGNOSIS — K921 Melena: Secondary | ICD-10-CM | POA: Diagnosis not present

## 2020-09-29 NOTE — Patient Instructions (Signed)
Shoulder Exercises Ask your health care provider which exercises are safe for you. Do exercises exactly as told by your health care provider and adjust them as directed. It is normal to feel mild stretching, pulling, tightness, or discomfort as you do these exercises. Stop right away if you feel sudden pain or your pain gets worse. Do not begin these exercises until told by your health care provider. Stretching exercises External rotation and abduction This exercise is sometimes called corner stretch. This exercise rotates your arm outward (external rotation) and moves your arm out from your body (abduction). 1. Stand in a doorway with one of your feet slightly in front of the other. This is called a staggered stance. If you cannot reach your forearms to the door frame, stand facing a corner of a room. 2. Choose one of the following positions as told by your health care provider: ? Place your hands and forearms on the door frame above your head. ? Place your hands and forearms on the door frame at the height of your head. ? Place your hands on the door frame at the height of your elbows. 3. Slowly move your weight onto your front foot until you feel a stretch across your chest and in the front of your shoulders. Keep your head and chest upright and keep your abdominal muscles tight. 4. Hold for __________ seconds. 5. To release the stretch, shift your weight to your back foot. Repeat __________ times. Complete this exercise __________ times a day.   Extension, standing 1. Stand and hold a broomstick, a cane, or a similar object behind your back. ? Your hands should be a little wider than shoulder width apart. ? Your palms should face away from your back. 2. Keeping your elbows straight and your shoulder muscles relaxed, move the stick away from your body until you feel a stretch in your shoulders (extension). ? Avoid shrugging your shoulders while you move the stick. Keep your shoulder blades  tucked down toward the middle of your back. 3. Hold for __________ seconds. 4. Slowly return to the starting position. Repeat __________ times. Complete this exercise __________ times a day. Range-of-motion exercises Pendulum 1. Stand near a wall or a surface that you can hold onto for balance. 2. Bend at the waist and let your left / right arm hang straight down. Use your other arm to support you. Keep your back straight and do not lock your knees. 3. Relax your left / right arm and shoulder muscles, and move your hips and your trunk so your left / right arm swings freely. Your arm should swing because of the motion of your body, not because you are using your arm or shoulder muscles. 4. Keep moving your hips and trunk so your arm swings in the following directions, as told by your health care provider: ? Side to side. ? Forward and backward. ? In clockwise and counterclockwise circles. 5. Continue each motion for __________ seconds, or for as long as told by your health care provider. 6. Slowly return to the starting position. Repeat __________ times. Complete this exercise __________ times a day.   Shoulder flexion, standing 1. Stand and hold a broomstick, a cane, or a similar object. Place your hands a little more than shoulder width apart on the object. Your left / right hand should be palm up, and your other hand should be palm down. 2. Keep your elbow straight and your shoulder muscles relaxed. Push the stick up with your healthy   arm to raise your left / right arm in front of your body, and then over your head until you feel a stretch in your shoulder (flexion). ? Avoid shrugging your shoulder while you raise your arm. Keep your shoulder blade tucked down toward the middle of your back. 3. Hold for __________ seconds. 4. Slowly return to the starting position. Repeat __________ times. Complete this exercise __________ times a day.   Shoulder abduction, standing 1. Stand and hold a  broomstick, a cane, or a similar object. Place your hands a little more than shoulder width apart on the object. Your left / right hand should be palm up, and your other hand should be palm down. 2. Keep your elbow straight and your shoulder muscles relaxed. Push the object across your body toward your left / right side. Raise your left / right arm to the side of your body (abduction) until you feel a stretch in your shoulder. ? Do not raise your arm above shoulder height unless your health care provider tells you to do that. ? If directed, raise your arm over your head. ? Avoid shrugging your shoulder while you raise your arm. Keep your shoulder blade tucked down toward the middle of your back. 3. Hold for __________ seconds. 4. Slowly return to the starting position. Repeat __________ times. Complete this exercise __________ times a day. Internal rotation 1. Place your left / right hand behind your back, palm up. 2. Use your other hand to dangle an exercise band, a towel, or a similar object over your shoulder. Grasp the band with your left / right hand so you are holding on to both ends. 3. Gently pull up on the band until you feel a stretch in the front of your left / right shoulder. The movement of your arm toward the center of your body is called internal rotation. ? Avoid shrugging your shoulder while you raise your arm. Keep your shoulder blade tucked down toward the middle of your back. 4. Hold for __________ seconds. 5. Release the stretch by letting go of the band and lowering your hands. Repeat __________ times. Complete this exercise __________ times a day.   Strengthening exercises External rotation 1. Sit in a stable chair without armrests. 2. Secure an exercise band to a stable object at elbow height on your left / right side. 3. Place a soft object, such as a folded towel or a small pillow, between your left / right upper arm and your body to move your elbow about 4 inches (10  cm) away from your side. 4. Hold the end of the exercise band so it is tight and there is no slack. 5. Keeping your elbow pressed against the soft object, slowly move your forearm out, away from your abdomen (external rotation). Keep your body steady so only your forearm moves. 6. Hold for __________ seconds. 7. Slowly return to the starting position. Repeat __________ times. Complete this exercise __________ times a day.   Shoulder abduction 1. Sit in a stable chair without armrests, or stand up. 2. Hold a __________ weight in your left / right hand, or hold an exercise band with both hands. 3. Start with your arms straight down and your left / right palm facing in, toward your body. 4. Slowly lift your left / right hand out to your side (abduction). Do not lift your hand above shoulder height unless your health care provider tells you that this is safe. ? Keep your arms straight. ? Avoid   shrugging your shoulder while you do this movement. Keep your shoulder blade tucked down toward the middle of your back. 5. Hold for __________ seconds. 6. Slowly lower your arm, and return to the starting position. Repeat __________ times. Complete this exercise __________ times a day.   Shoulder extension 1. Sit in a stable chair without armrests, or stand up. 2. Secure an exercise band to a stable object in front of you so it is at shoulder height. 3. Hold one end of the exercise band in each hand. Your palms should face each other. 4. Straighten your elbows and lift your hands up to shoulder height. 5. Step back, away from the secured end of the exercise band, until the band is tight and there is no slack. 6. Squeeze your shoulder blades together as you pull your hands down to the sides of your thighs (extension). Stop when your hands are straight down by your sides. Do not let your hands go behind your body. 7. Hold for __________ seconds. 8. Slowly return to the starting position. Repeat __________  times. Complete this exercise __________ times a day. Shoulder row 1. Sit in a stable chair without armrests, or stand up. 2. Secure an exercise band to a stable object in front of you so it is at waist height. 3. Hold one end of the exercise band in each hand. Position your palms so that your thumbs are facing the ceiling (neutral position). 4. Bend each of your elbows to a 90-degree angle (right angle) and keep your upper arms at your sides. 5. Step back until the band is tight and there is no slack. 6. Slowly pull your elbows back behind you. 7. Hold for __________ seconds. 8. Slowly return to the starting position. Repeat __________ times. Complete this exercise __________ times a day. Shoulder press-ups 1. Sit in a stable chair that has armrests. Sit upright, with your feet flat on the floor. 2. Put your hands on the armrests so your elbows are bent and your fingers are pointing forward. Your hands should be about even with the sides of your body. 3. Push down on the armrests and use your arms to lift yourself off the chair. Straighten your elbows and lift yourself up as much as you comfortably can. ? Move your shoulder blades down, and avoid letting your shoulders move up toward your ears. ? Keep your feet on the ground. As you get stronger, your feet should support less of your body weight as you lift yourself up. 4. Hold for __________ seconds. 5. Slowly lower yourself back into the chair. Repeat __________ times. Complete this exercise __________ times a day.   Wall push-ups 1. Stand so you are facing a stable wall. Your feet should be about one arm-length away from the wall. 2. Lean forward and place your palms on the wall at shoulder height. 3. Keep your feet flat on the floor as you bend your elbows and lean forward toward the wall. 4. Hold for __________ seconds. 5. Straighten your elbows to push yourself back to the starting position. Repeat __________ times. Complete this  exercise __________ times a day.   This information is not intended to replace advice given to you by your health care provider. Make sure you discuss any questions you have with your health care provider. Document Revised: 08/30/2018 Document Reviewed: 06/07/2018 [employer] Patient Education  2021 [employer]  

## 2020-09-29 NOTE — Progress Notes (Signed)
Dg right s  New Patient Office Visit  Subjective:  Patient ID: Dennis Owens, male    DOB: 19-Dec-1985  Age: 35 y.o. MRN: [id number]  CC:  Chief Complaint  Patient presents with  . New Patient (Initial Visit)    HPI [name] presents to establish new primary care provider. Today, he states that he has noted blood in his stool. Has noted this happening for past few weeks. He states that this is happening after having bowel movements. He states that he is having loose stools rather than constipation. He denies abdominal pain or intestinal cramping. He denies constipation. He denies changes in his appetite. He denies excess or unusual fatigue. He states that he does have history of external hemorrhoids. He has had a colonoscopy in the past which did not show abnormalities in the intestine.  He states that he has right shoulder pain. This is more severe when reaching behind his back. His range of motion of the right arm is limited due to right shoulder pain. He denies injury or trauma to the right shoulder. He is able to cross his arm in front of the body. Reaching up and behind him increases the pain.  The patient denies chest pain, chest pressure, or shortness of breath. He denies headaches or visual disturbances.   Past Medical History:  Diagnosis Date  . Anal fissure   . Internal hemorrhoids   . Milk intolerance     Past Surgical History:  Procedure Laterality Date  . VASECTOMY N/A    Phreesia 09/26/2020  . WISDOM TOOTH EXTRACTION      Family History  Problem Relation Age of Onset  . Healthy Mother   . Healthy Father   . Alcoholism Father   . Healthy Sister   . Healthy Brother   . Healthy Daughter   . Healthy Son   . Diabetes Maternal Grandmother   . Healthy Brother   . Colon cancer Neg Hx   . Stomach cancer Neg Hx   . Rectal cancer Neg Hx   . Esophageal cancer Neg Hx   . Liver cancer Neg Hx     Social History   Socioeconomic History  . Marital status:  Married    Spouse name: Not on file  . Number of children: 2  . Years of education: Not on file  . Highest education level: Not on file  Occupational History  . Not on file  Tobacco Use  . Smoking status: Never Smoker  . Smokeless tobacco: Never Used  Substance and Sexual Activity  . Alcohol use: Not Currently    Comment: occassionally  . Drug use: No  . Sexual activity: Yes    Birth control/protection: Condom  Other Topics Concern  . Not on file  Social History Narrative  . Not on file   Social Determinants of Health   Financial Resource Strain: Not on file  Food Insecurity: Not on file  Transportation Needs: Not on file  Physical Activity: Not on file  Stress: Not on file  Social Connections: Not on file  Intimate Partner Violence: Not on file    ROS Review of Systems  Constitutional: Negative for activity change, chills and fever.  HENT: Negative for congestion, postnasal drip, rhinorrhea, sinus pressure and sinus pain.   Eyes: Negative.   Respiratory: Negative for cough, chest tightness and wheezing.   Cardiovascular: Negative for chest pain and palpitations.  Gastrointestinal: Positive for blood in stool and diarrhea. Negative for abdominal  pain, constipation, nausea and vomiting.  Endocrine: Negative for cold intolerance, heat intolerance, polydipsia and polyuria.  Musculoskeletal: Positive for arthralgias. Negative for back pain and myalgias.       Right shoulder pain with reduced ROM due to pain in shoulder.   Skin: Negative for rash.  Allergic/Immunologic: Negative.   Neurological: Negative for dizziness, weakness and headaches.  Hematological: Negative for adenopathy.  Psychiatric/Behavioral: Negative for dysphoric mood. The patient is not nervous/anxious.     Objective:   Today's Vitals   09/29/20 1610  BP: 125/69  Pulse: 64  Temp: 98 F (36.7 C)  SpO2: 99%  Weight: 226 lb (102.5 kg)  Height: 5\' 10"  (1.778 m)   Body mass index is 32.43  kg/m. Physical Exam Vitals and nursing note reviewed.  Constitutional:      Appearance: Normal appearance. He is well-developed.  HENT:     Head: Normocephalic and atraumatic.     Nose: Nose normal.  Eyes:     Extraocular Movements: Extraocular movements intact.     Conjunctiva/sclera: Conjunctivae normal.     Pupils: Pupils are equal, round, and reactive to light.  Cardiovascular:     Rate and Rhythm: Normal rate and regular rhythm.     Pulses: Normal pulses.     Heart sounds: Normal heart sounds.  Pulmonary:     Effort: Pulmonary effort is normal.     Breath sounds: Normal breath sounds.  Abdominal:     General: Bowel sounds are normal.     Palpations: Abdomen is soft.     Tenderness: There is no abdominal tenderness.  Musculoskeletal:        General: Normal range of motion.     Cervical back: Normal range of motion and neck supple.     Comments: Tenderness of right shoulder with palpation. Patient is able to abduct the shoulder. He has negative scratch test and negative empty can test. He does have positive scratch test on the right side. He cannot vertically or horizontally lift the right arm past 90 degrees. There is no swelling or abnormalities noted. No crepitus can be felt with movement.   Skin:    General: Skin is warm and dry.     Capillary Refill: Capillary refill takes less than 2 seconds.  Neurological:     General: No focal deficit present.     Mental Status: He is alert and oriented to person, place, and time.  Psychiatric:        Mood and Affect: Mood normal.        Behavior: Behavior normal.        Thought Content: Thought content normal.        Judgment: Judgment normal.     Assessment & Plan:  1. Encounter to establish care Appointment today to establish new primary care provider.   2. Blood in the stool Blood noted in stool with loose bowel movements for past few weeks. Refer to GI for further evaluation and treatment.  - Ambulatory referral to  Gastroenterology  3. Bleeding external hemorrhoids History of bleeding external hemorrhoids. Refer to GI for further evaluation and treatment.  - Ambulatory referral to Gastroenterology  4. Right shoulder pain, unspecified chronicity Apply ice to sore right shoulder. May take tylenol/ibuprofen as needed and as indicated for pain. Get X-ray of right shoulder for further evaluation. Refer to orthopedics as indicated.  - DG Shoulder Right; Future  Problem List Items Addressed This Visit      Cardiovascular and Mediastinum  Bleeding external hemorrhoids   Relevant Orders   Ambulatory referral to Gastroenterology     Other   Encounter to establish care - Primary   Blood in the stool   Relevant Orders   Ambulatory referral to Gastroenterology   Right shoulder pain   Relevant Orders   DG Shoulder Right      No outpatient encounter medications on file as of 09/29/2020.   No facility-administered encounter medications on file as of 09/29/2020.    Follow-up: Return in about 6 weeks (around 11/10/2020) for physical - next monday FBW with ferritin, b12, and folate level. Marland Kitchen   Carlean Jews, NP

## 2020-10-04 ENCOUNTER — Other Ambulatory Visit: Payer: BC Managed Care – PPO

## 2020-10-04 ENCOUNTER — Other Ambulatory Visit: Payer: Self-pay | Admitting: Nurse Practitioner

## 2020-10-04 ENCOUNTER — Other Ambulatory Visit: Payer: Self-pay

## 2020-10-04 DIAGNOSIS — Z0000 Encounter for general adult medical examination without abnormal findings: Secondary | ICD-10-CM

## 2020-10-04 DIAGNOSIS — R7989 Other specified abnormal findings of blood chemistry: Secondary | ICD-10-CM

## 2020-10-04 NOTE — Addendum Note (Signed)
Addended by: Lupita Leash on: 10/04/2020 08:27 AM   Modules accepted: Orders

## 2020-10-05 ENCOUNTER — Encounter: Payer: Self-pay | Admitting: Nurse Practitioner

## 2020-10-05 LAB — CBC
Hematocrit: 43.8 % (ref 37.5–51.0)
Hemoglobin: 15.3 g/dL (ref 13.0–17.7)
MCH: 32.8 pg (ref 26.6–33.0)
MCHC: 34.9 g/dL (ref 31.5–35.7)
MCV: 94 fL (ref 79–97)
Platelets: 167 10*3/uL (ref 150–450)
RBC: 4.66 x10E6/uL (ref 4.14–5.80)
RDW: 12.4 % (ref 11.6–15.4)
WBC: 3.9 10*3/uL (ref 3.4–10.8)

## 2020-10-05 LAB — LIPID PANEL
Chol/HDL Ratio: 2.6 ratio (ref 0.0–5.0)
Cholesterol, Total: 150 mg/dL (ref 100–199)
HDL: 58 mg/dL (ref >39)
LDL Chol Calc (NIH): 80 mg/dL (ref 0–99)
Triglycerides: 59 mg/dL (ref 0–149)
VLDL Cholesterol Cal: 12 mg/dL (ref 5–40)

## 2020-10-05 LAB — COMPREHENSIVE METABOLIC PANEL
ALT: 37 IU/L (ref 0–44)
AST: 25 IU/L (ref 0–40)
Albumin/Globulin Ratio: 1.9 (ref 1.2–2.2)
Albumin: 4.4 g/dL (ref 4.0–5.0)
Alkaline Phosphatase: 56 IU/L (ref 44–121)
BUN/Creatinine Ratio: 13 (ref 9–20)
BUN: 12 mg/dL (ref 6–20)
Bilirubin Total: 0.5 mg/dL (ref 0.0–1.2)
CO2: 23 mmol/L (ref 20–29)
Calcium: 9.1 mg/dL (ref 8.7–10.2)
Chloride: 106 mmol/L (ref 96–106)
Creatinine, Ser: 0.91 mg/dL (ref 0.76–1.27)
Globulin, Total: 2.3 g/dL (ref 1.5–4.5)
Glucose: 84 mg/dL (ref 65–99)
Potassium: 4.5 mmol/L (ref 3.5–5.2)
Sodium: 142 mmol/L (ref 134–144)
Total Protein: 6.7 g/dL (ref 6.0–8.5)
eGFR: 113 mL/min/{1.73_m2} (ref >59)

## 2020-10-05 LAB — FERRITIN: Ferritin: 58 ng/mL (ref 30–400)

## 2020-10-05 LAB — B12 AND FOLATE PANEL
Folate: 13.8 ng/mL (ref >3.0)
Vitamin B-12: 291 pg/mL (ref 232–1245)

## 2020-10-05 LAB — HEMOGLOBIN A1C
Est. average glucose Bld gHb Est-mCnc: 103 mg/dL
Hgb A1c MFr Bld: 5.2 % (ref 4.8–5.6)

## 2020-10-05 LAB — TSH: TSH: 0.525 u[IU]/mL (ref 0.450–4.500)

## 2020-10-05 NOTE — Progress Notes (Signed)
Labs good. Note sent to patient in MyChart.

## 2020-10-11 ENCOUNTER — Ambulatory Visit: Payer: BC Managed Care – PPO | Admitting: Nurse Practitioner

## 2020-10-12 ENCOUNTER — Telehealth: Payer: Self-pay | Admitting: Nurse Practitioner

## 2020-10-12 NOTE — Telephone Encounter (Signed)
Called pt he is advised about his lab results, provided information about where he can go for his x-ray to be done

## 2020-10-12 NOTE — Telephone Encounter (Signed)
Thank you :)

## 2020-10-12 NOTE — Telephone Encounter (Signed)
Patient would like to know the results of his labs, the status of his x-ray referral. Please advise, thanks.

## 2020-10-13 DIAGNOSIS — K921 Melena: Secondary | ICD-10-CM | POA: Insufficient documentation

## 2020-10-13 DIAGNOSIS — M25511 Pain in right shoulder: Secondary | ICD-10-CM | POA: Insufficient documentation

## 2020-10-13 DIAGNOSIS — Z7689 Persons encountering health services in other specified circumstances: Secondary | ICD-10-CM | POA: Insufficient documentation

## 2020-10-27 ENCOUNTER — Ambulatory Visit: Payer: BC Managed Care – PPO | Admitting: Gastroenterology

## 2020-11-01 ENCOUNTER — Ambulatory Visit: Payer: BC Managed Care – PPO | Admitting: Physician Assistant

## 2020-11-15 ENCOUNTER — Encounter: Payer: BC Managed Care – PPO | Admitting: Nurse Practitioner

## 2020-11-19 ENCOUNTER — Ambulatory Visit (INDEPENDENT_AMBULATORY_CARE_PROVIDER_SITE_OTHER): Payer: BC Managed Care – PPO | Admitting: Gastroenterology

## 2020-11-19 ENCOUNTER — Other Ambulatory Visit: Payer: Self-pay

## 2020-11-19 ENCOUNTER — Encounter: Payer: Self-pay | Admitting: Gastroenterology

## 2020-11-19 ENCOUNTER — Telehealth: Payer: Self-pay | Admitting: Gastroenterology

## 2020-11-19 VITALS — BP 120/78 | HR 68 | Ht 71.5 in | Wt 226.8 lb

## 2020-11-19 DIAGNOSIS — K648 Other hemorrhoids: Secondary | ICD-10-CM | POA: Diagnosis not present

## 2020-11-19 DIAGNOSIS — R195 Other fecal abnormalities: Secondary | ICD-10-CM | POA: Diagnosis not present

## 2020-11-19 DIAGNOSIS — K625 Hemorrhage of anus and rectum: Secondary | ICD-10-CM | POA: Insufficient documentation

## 2020-11-19 MED ORDER — HYDROCORTISONE ACETATE 25 MG RE SUPP: 25.0000 mg | Freq: Two times a day (BID) | RECTAL | 3 refills | Status: DC

## 2020-11-19 MED ORDER — HYDROCORTISONE (PERIANAL) 2.5 % EX CREA: 1.0000 "application " | TOPICAL_CREAM | Freq: Two times a day (BID) | CUTANEOUS | 1 refills | Status: DC

## 2020-11-19 NOTE — Telephone Encounter (Signed)
Returned patients call and let him know that Hydrocortisone cream 2.5 % has been sent to his pharmacy. Also let patient know that preparation H suppositories could be purchased over the counter.

## 2020-11-19 NOTE — Patient Instructions (Addendum)
If you are age 34 or older, your body mass index should be between 23-30. Your Body mass index is 31.19 kg/m. If this is out of the aforementioned range listed, please consider follow up with your Primary Care Provider.  If you are age 14 or younger, your body mass index should be between 19-25. Your Body mass index is 31.19 kg/m. If this is out of the aformentioned range listed, please consider follow up with your Primary Care Provider.   Start Benefiber or Citrucel 2 teaspoons in 8 ounces of liquid daily and may increase to twice daily if tolerated.   We have sent the following medications to your pharmacy for you to pick up at your convenience: Hydrocortisone suppositories once a day at bedtime.  Call back if you want banding   The [facility] providers would like to encourage you to use [facility] to communicate with providers for non-urgent requests or questions.  Due to long hold times on the telephone, sending your provider a message by [facility] may be a faster and more efficient way to get a response.  Please allow 48 business hours for a response.  Please remember that this is for non-urgent requests.   Thank you for choosing me and [facility].  Doug Sou, PA-C

## 2020-11-19 NOTE — Telephone Encounter (Signed)
Inbound call from patient stating insurance is not going to cover the suppositories and is requesting alternate medication be sent to pharmacy please.

## 2020-11-19 NOTE — Progress Notes (Signed)
11/19/2020 Dennis Owens [id number] Mar 23, 1986   HISTORY OF PRESENT ILLNESS:  This is a 35 year old male who is a patient of Dr. Ardell Isaacs, seen by him in 2019 for loose stools, internal hemorrhoid bleeding, and elevated LFTs.  LFTs thought to be due to fatty liver on ultrasound.  Other serologies negative.  Most recent LFTs were normal.  Celiac labs were performed and were negative.  He is here today with complaints of loose stools and rectal bleeding.  He says that he has only one bowel movement daily, but it tends to be very loose in consistency.  He has been seeing blood with bowel movements for a long time, but more frequent recently, about with every bowel movement.  He denies any rectal pain, abdominal pain, etc.  He tells me that he intends to do nondairy milk alternatives.  He says that some ice creams he can eat without getting diarrhea and other ice creams will give him diarrhea within 5 minutes.  He had a colonoscopy with Dr. Jennye Boroughs in [place] in May 2015 that was performed for heme positive stools and that showed internal hemorrhoids and anal canal inflammation.  Referred here on this occasion by Vincent Gros, NP, for evaluation of rectal bleeding.   Past Medical History:  Diagnosis Date   Anal fissure    Internal hemorrhoids    Milk intolerance    Past Surgical History:  Procedure Laterality Date   VASECTOMY N/A    Phreesia 09/26/2020   WISDOM TOOTH EXTRACTION      reports that he has never smoked. He has never used smokeless tobacco. He reports previous alcohol use. He reports that he does not use drugs. family history includes Alcoholism in his father; Diabetes in his maternal grandmother; Healthy in his brother, brother, daughter, father, mother, sister, and son. Allergies  Allergen Reactions   Penicillins Rash      No outpatient encounter medications on file as of 11/19/2020.   No facility-administered encounter medications on file as of 11/19/2020.     REVIEW OF SYSTEMS  : All other systems reviewed and negative except where noted in the History of Present Illness.   PHYSICAL EXAM: BP 120/78   Pulse 68   Ht 5' 11.5" (1.816 m)   Wt 226 lb 12.8 oz (102.9 kg)   BMI 31.19 kg/m  General: Well developed white male in no acute distress Head: Normocephalic and atraumatic Eyes:  Sclerae anicteric, conjunctiva pink. Ears: Normal auditory acuity Lungs: Clear throughout to auscultation; no W/R/R. Heart: Regular rate and rhythm; no M/R/G. Abdomen: Soft, non-distended.  BS present.  Non-tender. Rectal:  Small to medium external hemorrhoids noted, non-inflamed and non-bleeding.  DRE did not reveal any masses or pain.  Anoscopy revealed very friable and inflamed internal hemorrhoids. Musculoskeletal: Symmetrical with no gross deformities  Skin: No lesions on visible extremities Extremities: No edema  Neurological: Alert oriented x 4, grossly non-focal Psychological:  Alert and cooperative. Normal mood and affect  ASSESSMENT AND PLAN: *Rectal bleeding:  Secondary to inflamed internal hemorrhoids on exam today.  We discussed internal hemorrhoid banding but patient does not think that he could tolerate that (uncomfortable and tense).  Will try hydrocortisone suppositories at bedtime for 7 days, repeat prn.  Prescription sent to pharmacy. *Loose stools:  One BM per day, but usually loose. Reports some lactose intolerance but still consumes some dairy products so that could be a component of it.  Will try Benefiber one tsp in 8  ounces of liquid daily and increase to BID if tolerated/needed.  Hopefully this will help with some bulk.  Discussed trying some Lactaid pills if he eats dairy containing products.  CC:  Carlean Jews, NP

## 2020-11-22 NOTE — Progress Notes (Signed)
Reviewed and agree with management plan. If rectal bleeding does not resolve soon with treatment of his hemorrhoids recommend colonoscopy for further evaluation.   Venita Lick. Russella Dar, MD FACG [phone]

## 2020-12-07 ENCOUNTER — Telehealth: Payer: Self-pay

## 2020-12-07 NOTE — Telephone Encounter (Signed)
-----   Message from Leta Baptist, PA-C sent at 12/07/2020  1:01 PM EDT ----- I saw this patient almost 3 weeks ago.  Will you please see if he has been using hydrocortisone suppositories and if they have helped his bleeding?  Thank you,  Jess  ----- Message ----- From: Meryl Dare, MD Sent: 11/22/2020   2:59 PM EDT To: Leta Baptist, PA-C     ----- Message ----- From: Leta Baptist, PA-C Sent: 11/19/2020  12:43 PM EDT To: Meryl Dare, MD

## 2020-12-07 NOTE — Telephone Encounter (Signed)
The pt states he is doing very well and will call if he has any further concerns

## 2020-12-19 ENCOUNTER — Other Ambulatory Visit: Payer: Self-pay

## 2020-12-19 ENCOUNTER — Emergency Department (HOSPITAL_COMMUNITY)
Admission: EM | Admit: 2020-12-19 | Discharge: 2020-12-20 | Disposition: A | Discharge status: Home / Self Care | Payer: BC Managed Care – PPO | Attending: Emergency Medicine | Admitting: Emergency Medicine

## 2020-12-19 DIAGNOSIS — W268XXA Contact with other sharp object(s), not elsewhere classified, initial encounter: Secondary | ICD-10-CM | POA: Diagnosis not present

## 2020-12-19 DIAGNOSIS — Y92007 Garden or yard of unspecified non-institutional (private) residence as the place of occurrence of the external cause: Secondary | ICD-10-CM | POA: Diagnosis not present

## 2020-12-19 DIAGNOSIS — Y9389 Activity, other specified: Secondary | ICD-10-CM | POA: Diagnosis not present

## 2020-12-19 DIAGNOSIS — T1501XA Foreign body in cornea, right eye, initial encounter: Secondary | ICD-10-CM | POA: Insufficient documentation

## 2020-12-19 DIAGNOSIS — H5789 Other specified disorders of eye and adnexa: Secondary | ICD-10-CM

## 2020-12-19 DIAGNOSIS — T1590XA Foreign body on external eye, part unspecified, unspecified eye, initial encounter: Secondary | ICD-10-CM

## 2020-12-19 NOTE — ED Provider Notes (Signed)
Emergency Medicine Provider Triage Evaluation Note  Dennis Owens , a 35 y.o. male  was evaluated in triage.  Pt complains of right eye pain and irritation.  Symptoms began yesterday when he was working outside.  Worsening today.  He reports pain, swelling, raised area in his right eye.  No vision change.  Wears contacts, but none since the symptoms began.  Has used Visine drops without improvement.  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  Review of Systems  Positive: R eye irritation and pain Negative: Vision changes  Physical Exam  BP (!) 140/92 (BP Location: Left Arm)   Pulse 65   Temp 98.1 F (36.7 C) (Oral)   Resp 15   Ht 5\' 11"  (1.803 m)   Wt 102.5 kg   SpO2 99%   BMI 31.52 kg/m  Gen:   Awake, no distress   Resp:  Normal effort  MSK:   Moves extremities without difficulty  Other:  Chemosis of the R lateral eye with mild injection  Medical Decision Making  Medically screening exam initiated at 9:08 PM.  Appropriate orders placed.  Owens was informed that the remainder of the evaluation will be completed by another provider, this initial triage assessment does not replace that evaluation, and the importance of remaining in the ED until their evaluation is complete.  Likely allergic/irritation chemosis, however consider abrasion/foreign body.    Oneita Kras, PA-C 12/19/20 2113    2114, MD 12/19/20 [phone]

## 2020-12-19 NOTE — ED Triage Notes (Signed)
Pt has a raised area on the white part of his right eye.  Sometimes feels tender.  STarted today after working in yard

## 2020-12-20 MED ORDER — TETRACAINE HCL 0.5 % OP SOLN
2.0000 [drp] | Freq: Once | OPHTHALMIC | Status: AC
  Administered 2020-12-20: 2 [drp] via OPHTHALMIC
  Filled 2020-12-20: qty 4

## 2020-12-20 MED ORDER — CIPROFLOXACIN HCL 0.3 % OP SOLN
1.0000 [drp] | OPHTHALMIC | Status: DC
  Administered 2020-12-20: 1 [drp] via OPHTHALMIC
  Filled 2020-12-20: qty 2.5

## 2020-12-20 MED ORDER — FLUORESCEIN SODIUM 1 MG OP STRP
1.0000 | ORAL_STRIP | Freq: Once | OPHTHALMIC | Status: AC
  Administered 2020-12-20: 1 via OPHTHALMIC
  Filled 2020-12-20: qty 1

## 2020-12-20 MED ORDER — KETOROLAC TROMETHAMINE 0.5 % OP SOLN
1.0000 [drp] | OPHTHALMIC | Status: AC
  Administered 2020-12-20: 1 [drp] via OPHTHALMIC
  Filled 2020-12-20: qty 5

## 2020-12-20 NOTE — ED Notes (Signed)
Patient verbalizes understanding of discharge instructions. Prescriptions and follow-up care reviewed. Opportunity for questioning and answers were provided. Armband removed by staff, pt discharged from ED ambulatory.  

## 2020-12-20 NOTE — ED Provider Notes (Signed)
[facility] EMERGENCY DEPARTMENT Provider Note   CSN: [id number] Arrival date & time: 12/19/20  1950     History Chief Complaint  Patient presents with   Eye Pain    Dennis Owens is a 35 y.o. male.  Patient presents to the emergency department for evaluation of bilateral eye irritation.  Patient reports that symptoms began yesterday when he was working outside, cutting wood with a chainsaw.  He thinks he got some wood in his eyes, has been irrigating them but still occasionally feels like there is something in the eye.  He came in tonight because he developed a blister on the lateral aspect of the right eye that caused him to have trouble looking to the right.      Past Medical History:  Diagnosis Date   Anal fissure    Internal hemorrhoids    Milk intolerance     Patient Active Problem List   Diagnosis Date Noted   Internal hemorrhoids 11/19/2020   Rectal bleeding 11/19/2020   Loose stools 11/19/2020   Encounter to establish care 10/13/2020   Blood in the stool 10/13/2020   Right shoulder pain 10/13/2020   Folliculitis barbae 07/06/2017   History of colonoscopy 12/29/2016   1 time h/o Urine WBC increased 09/28/2016   Anal fissure, unspecified 09/28/2016   Bleeding external hemorrhoids 09/28/2016   Milk intolerance 09/28/2016   Chronic diarrhea 09/28/2016   Low TSH level 09/28/2016   Elevated liver enzymes 09/28/2016   Family history of diabetes mellitus- MGM 05/10/2016   Obesity, Class I, BMI 30-34.9 05/10/2016    Past Surgical History:  Procedure Laterality Date   VASECTOMY N/A    Phreesia 09/26/2020   WISDOM TOOTH EXTRACTION         Family History  Problem Relation Age of Onset   Healthy Mother    Healthy Father    Alcoholism Father    Healthy Sister    Healthy Brother    Healthy Daughter    Healthy Son    Diabetes Maternal Grandmother    Healthy Brother    Colon cancer Neg Hx    Stomach cancer Neg Hx    Rectal cancer Neg  Hx    Esophageal cancer Neg Hx    Liver cancer Neg Hx     Social History   Tobacco Use   Smoking status: Never   Smokeless tobacco: Never  Substance Use Topics   Alcohol use: Not Currently    Comment: occassionally   Drug use: No    Home Medications Prior to Admission medications   Medication Sig Start Date End Date Taking? Authorizing Provider  hydrocortisone (ANUSOL-HC) 2.5 % rectal cream Place 1 application rectally 2 (two) times daily. 11/19/20   Zehr, Princella Pellegrini, PA-C  hydrocortisone (ANUSOL-HC) 25 MG suppository Place 1 suppository (25 mg total) rectally every 12 (twelve) hours. 11/19/20   Zehr, Princella Pellegrini, PA-C    Allergies    Penicillins  Review of Systems   Review of Systems  Eyes:  Positive for pain and redness.  All other systems reviewed and are negative.  Physical Exam Updated Vital Signs BP 123/84 (BP Location: Left Arm)   Pulse (!) 51   Temp 98.1 F (36.7 C) (Oral)   Resp 16   Ht 5\' 11"  (1.803 m)   Wt 102.5 kg   SpO2 100%   BMI 31.52 kg/m   Physical Exam Constitutional:      Appearance: Normal appearance.  HENT:  Head: Atraumatic.  Eyes:     General: Lids are normal. Lids are everted, no foreign bodies appreciated. Vision grossly intact. Gaze aligned appropriately.     Extraocular Movements: Extraocular movements intact.     Conjunctiva/sclera:     Right eye: Right conjunctiva is injected. No exudate or hemorrhage.    Left eye: Left conjunctiva is injected. No exudate or hemorrhage.    Pupils: Pupils are equal, round, and reactive to light.     Right eye: No corneal abrasion or fluorescein uptake. Seidel exam negative.     Left eye: No corneal abrasion or fluorescein uptake. Seidel exam negative. Neurological:     Mental Status: He is alert.    ED Results / Procedures / Treatments   Labs (all labs ordered are listed, but only abnormal results are displayed) Labs Reviewed - No data to display  EKG None  Radiology No results  found.  Procedures Procedures   Medications Ordered in ED Medications  ketorolac (ACULAR) 0.5 % ophthalmic solution 1 drop (has no administration in time range)  ciprofloxacin (CILOXAN) 0.3 % ophthalmic solution 1 drop (has no administration in time range)  tetracaine (PONTOCAINE) 0.5 % ophthalmic solution 2 drop (2 drops Right Eye Given 12/20/20 0309)  fluorescein ophthalmic strip 1 strip (1 strip Right Eye Given 12/20/20 0309)    ED Course  I have reviewed the triage vital signs and the nursing notes.  Pertinent labs & imaging results that were available during my care of the patient were reviewed by me and considered in my medical decision making (see chart for details).    MDM Rules/Calculators/A&P                           Patient presents to the emergency department for evaluation of bilateral eye irritation.  Patient thinks he got wood fibers in his eyes when working outside yesterday.  Symptoms worsened tonight which brought him to the ER.  It sounds as though he had fairly significant chemosis of the right eye tonight but this has since resolved.  He still has bilateral injection.  Corneal evaluation is normal, no fluorescein uptake.  No foreign bodies seen under the eyelids.  Eyes irrigated by nursing staff.  Acular for inflammation here in the ED, discharged with Cipro eyedrops.  Use Benadryl as needed for irritation.  Follow-up with his eye doctor if not better in 1 or 2 days, return if symptoms worsen. Final Clinical Impression(s) / ED Diagnoses Final diagnoses:  Eye irritation  Foreign body in eye, unspecified laterality, initial encounter    Rx / DC Orders ED Discharge Orders     None        Marveline Profeta, Canary Brim, MD 12/20/20 [phone]

## 2020-12-20 NOTE — Discharge Instructions (Addendum)
Put the eyedrops in your eyes every 4 hours while awake for 5 days.  Use Benadryl as needed for irritation and itching.  Cool moist compresses can help.  If you are not better in a couple of days, call your eye doctor.  If your symptoms worsen or your vision changes, come back to the ER.

## 2021-07-02 ENCOUNTER — Emergency Department (HOSPITAL_COMMUNITY)
Admission: EM | Admit: 2021-07-02 | Discharge: 2021-07-02 | Disposition: A | Discharge status: Home / Self Care | Payer: BC Managed Care – PPO | Attending: Emergency Medicine | Admitting: Emergency Medicine

## 2021-07-02 ENCOUNTER — Emergency Department (HOSPITAL_COMMUNITY): Payer: BC Managed Care – PPO

## 2021-07-02 ENCOUNTER — Encounter (HOSPITAL_COMMUNITY): Payer: Self-pay | Admitting: Emergency Medicine

## 2021-07-02 ENCOUNTER — Other Ambulatory Visit: Payer: Self-pay

## 2021-07-02 DIAGNOSIS — R519 Headache, unspecified: Secondary | ICD-10-CM | POA: Insufficient documentation

## 2021-07-02 DIAGNOSIS — M542 Cervicalgia: Secondary | ICD-10-CM | POA: Diagnosis not present

## 2021-07-02 DIAGNOSIS — Y92410 Unspecified street and highway as the place of occurrence of the external cause: Secondary | ICD-10-CM | POA: Diagnosis not present

## 2021-07-02 MED ORDER — METHOCARBAMOL 500 MG PO TABS: 500.0000 mg | ORAL_TABLET | Freq: Two times a day (BID) | ORAL | 0 refills | Status: DC

## 2021-07-02 MED ORDER — NAPROXEN 500 MG PO TABS: 500.0000 mg | ORAL_TABLET | Freq: Two times a day (BID) | ORAL | 0 refills | Status: AC
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
## 2021-07-02 NOTE — ED Notes (Signed)
Pt states he may have had  brief LOC
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
## 2021-07-02 NOTE — Discharge Instructions (Addendum)
I have prescribed muscle relaxers for your pain, please do not drink or drive while taking this medications as it can make you drowsy.     Please follow-up with PCP in 1 week for reevaluation of your symptoms.If you experience any bowel or bladder incontinence, fever, worsening in your symptoms please return to the ED.  

## 2021-07-02 NOTE — ED Notes (Signed)
Patient transported to X-ray 
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
## 2021-07-18 ENCOUNTER — Encounter: Payer: Self-pay | Admitting: Nurse Practitioner

## 2021-07-18 ENCOUNTER — Ambulatory Visit: Payer: BC Managed Care – PPO | Admitting: Nurse Practitioner

## 2021-07-18 ENCOUNTER — Other Ambulatory Visit: Payer: Self-pay

## 2021-07-18 DIAGNOSIS — M542 Cervicalgia: Secondary | ICD-10-CM | POA: Insufficient documentation

## 2021-07-18 DIAGNOSIS — G44209 Tension-type headache, unspecified, not intractable: Secondary | ICD-10-CM | POA: Insufficient documentation

## 2021-07-18 DIAGNOSIS — Z6832 Body mass index (BMI) 32.0-32.9, adult: Secondary | ICD-10-CM | POA: Diagnosis not present
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
## 2021-09-27 ENCOUNTER — Other Ambulatory Visit: Payer: Self-pay

## 2021-09-27 DIAGNOSIS — Z0000 Encounter for general adult medical examination without abnormal findings: Secondary | ICD-10-CM

## 2021-10-03 ENCOUNTER — Other Ambulatory Visit: Payer: BC Managed Care – PPO

## 2021-10-03 DIAGNOSIS — Z0000 Encounter for general adult medical examination without abnormal findings: Secondary | ICD-10-CM

## 2021-10-04 LAB — CBC WITH DIFFERENTIAL/PLATELET
Basophils Absolute: 0 10*3/uL (ref 0.0–0.2)
Basos: 0 %
EOS (ABSOLUTE): 0.1 10*3/uL (ref 0.0–0.4)
Eos: 3 %
Hematocrit: 46.8 % (ref 37.5–51.0)
Hemoglobin: 15.9 g/dL (ref 13.0–17.7)
Immature Grans (Abs): 0 10*3/uL (ref 0.0–0.1)
Immature Granulocytes: 0 %
Lymphocytes Absolute: 1.8 10*3/uL (ref 0.7–3.1)
Lymphs: 43 %
MCH: 31.8 pg (ref 26.6–33.0)
MCHC: 34 g/dL (ref 31.5–35.7)
MCV: 94 fL (ref 79–97)
Monocytes Absolute: 0.5 10*3/uL (ref 0.1–0.9)
Monocytes: 13 %
Neutrophils Absolute: 1.7 10*3/uL (ref 1.4–7.0)
Neutrophils: 41 %
Platelets: 186 10*3/uL (ref 150–450)
RBC: 5 x10E6/uL (ref 4.14–5.80)
RDW: 12.7 % (ref 11.6–15.4)
WBC: 4.1 10*3/uL (ref 3.4–10.8)

## 2021-10-04 LAB — COMPREHENSIVE METABOLIC PANEL
ALT: 64 IU/L — ABNORMAL HIGH (ref 0–44)
AST: 40 IU/L (ref 0–40)
Albumin/Globulin Ratio: 1.8 (ref 1.2–2.2)
Albumin: 4.6 g/dL (ref 4.0–5.0)
Alkaline Phosphatase: 57 IU/L (ref 44–121)
BUN/Creatinine Ratio: 15 (ref 9–20)
BUN: 15 mg/dL (ref 6–20)
Bilirubin Total: 0.5 mg/dL (ref 0.0–1.2)
CO2: 24 mmol/L (ref 20–29)
Calcium: 9.3 mg/dL (ref 8.7–10.2)
Chloride: 105 mmol/L (ref 96–106)
Creatinine, Ser: 0.97 mg/dL (ref 0.76–1.27)
Globulin, Total: 2.5 g/dL (ref 1.5–4.5)
Glucose: 92 mg/dL (ref 70–99)
Potassium: 4.6 mmol/L (ref 3.5–5.2)
Sodium: 142 mmol/L (ref 134–144)
Total Protein: 7.1 g/dL (ref 6.0–8.5)
eGFR: 104 mL/min/{1.73_m2} (ref >59)

## 2021-10-04 LAB — TSH: TSH: 0.664 u[IU]/mL (ref 0.450–4.500)

## 2021-10-04 LAB — LIPID PANEL
Chol/HDL Ratio: 2.4 ratio (ref 0.0–5.0)
Cholesterol, Total: 152 mg/dL (ref 100–199)
HDL: 63 mg/dL (ref >39)
LDL Chol Calc (NIH): 74 mg/dL (ref 0–99)
Triglycerides: 80 mg/dL (ref 0–149)
VLDL Cholesterol Cal: 15 mg/dL (ref 5–40)

## 2021-10-04 LAB — HEMOGLOBIN A1C
Est. average glucose Bld gHb Est-mCnc: 103 mg/dL
Hgb A1c MFr Bld: 5.2 % (ref 4.8–5.6)

## 2021-10-05 NOTE — Progress Notes (Signed)
Overall, labs good. Review with patient at visit 10/10/2021

## 2021-10-10 ENCOUNTER — Ambulatory Visit (INDEPENDENT_AMBULATORY_CARE_PROVIDER_SITE_OTHER): Payer: BC Managed Care – PPO | Admitting: Nurse Practitioner

## 2021-10-10 ENCOUNTER — Encounter: Payer: Self-pay | Admitting: Nurse Practitioner

## 2021-10-10 VITALS — BP 127/71 | HR 74 | Temp 97.9°F | Ht 70.08 in | Wt 234.8 lb

## 2021-10-10 DIAGNOSIS — Z0001 Encounter for general adult medical examination with abnormal findings: Secondary | ICD-10-CM

## 2021-10-10 DIAGNOSIS — K644 Residual hemorrhoidal skin tags: Secondary | ICD-10-CM | POA: Diagnosis not present

## 2021-10-10 DIAGNOSIS — R197 Diarrhea, unspecified: Secondary | ICD-10-CM | POA: Diagnosis not present

## 2021-10-10 DIAGNOSIS — K921 Melena: Secondary | ICD-10-CM

## 2021-10-10 DIAGNOSIS — R7401 Elevation of levels of liver transaminase levels: Secondary | ICD-10-CM

## 2021-10-10 NOTE — Progress Notes (Signed)
Complete physical exam   Patient: Dennis Owens   DOB: 03/07/1986   36 y.o. Male  MRN: [id number] Visit Date: 10/10/2021    Chief Complaint  Patient presents with   Annual Exam   Subjective    Dennis Owens is a 36 y.o. male who presents today for a complete physical exam.  He reports consuming a general diet. The patient does not participate in regular exercise at present. He generally feels well. He does not have additional problems to discuss today.  HPI  Patient here for routine physical exam.  -routine, fasting lab done prior to this visit.  -ALT elevated.  -all other labs normal  The patient has no problems or concerns today.   Past Medical History:  Diagnosis Date   Anal fissure    Internal hemorrhoids    Milk intolerance    Past Surgical History:  Procedure Laterality Date   VASECTOMY N/A    Phreesia 09/26/2020   WISDOM TOOTH EXTRACTION     Social History   Socioeconomic History   Marital status: Married    Spouse name: Not on file   Number of children: 2   Years of education: Not on file   Highest education level: Not on file  Occupational History   Not on file  Tobacco Use   Smoking status: Never   Smokeless tobacco: Never  Substance and Sexual Activity   Alcohol use: Not Currently    Comment: occassionally   Drug use: No   Sexual activity: Yes    Birth control/protection: Condom  Other Topics Concern   Not on file  Social History Narrative   Not on file   Social Determinants of Health   Financial Resource Strain: Not on file  Food Insecurity: Not on file  Transportation Needs: Not on file  Physical Activity: Not on file  Stress: Not on file  Social Connections: Not on file  Intimate Partner Violence: Not on file   Family Status  Relation Name Status   Mother  Alive   Father  Alive   Sister  Alive   Brother  Alive   Daughter  Alive   Son  Alive   MGM  Deceased   MGF  Alive   PGM  Deceased   PGF  Deceased   Brother  Alive    Neg Hx  (Not Specified)   Family History  Problem Relation Age of Onset   Healthy Mother    Healthy Father    Alcoholism Father    Healthy Sister    Healthy Brother    Healthy Daughter    Healthy Son    Diabetes Maternal Grandmother    Healthy Brother    Colon cancer Neg Hx    Stomach cancer Neg Hx    Rectal cancer Neg Hx    Esophageal cancer Neg Hx    Liver cancer Neg Hx    Allergies  Allergen Reactions   Penicillins Rash    Patient Care Team: Dennis Freshwater, NP as PCP - General (Family Medicine) Misenheimer, Christia Reading, MD as Consulting Physician (Unknown Physician Specialty)   Medications: No outpatient medications prior to visit.   No facility-administered medications prior to visit.    Review of Systems  Constitutional:  Negative for activity change, chills, fatigue and fever.  HENT:  Negative for congestion, postnasal drip, rhinorrhea, sinus pressure, sinus pain, sneezing and sore throat.   Eyes: Negative.   Respiratory:  Negative for cough, shortness of breath  and wheezing.   Cardiovascular:  Negative for chest pain and palpitations.  Gastrointestinal:  Positive for blood in stool and diarrhea. Negative for constipation, nausea and vomiting.       Patient with daily episodes of diarrhea.  Will have just a litte cramping then states that he has to use the bathroom right then. Has had colonoscopy in 2015 showing internal hemorrhoids and inflammatory changes of the anal canal and rectum. He did have a repeat colonoscopy in 2018, however, I cannot pull that report. He does see Dennis Owens here. Lat visit was 1 to 2 years ago. States that new colonoscopy has not been recommended yet. He is following a high fiber diet. He does not take pre or probiotics. He states that hemorrhoids bleed and he notes blood in his stool everyday.   Endocrine: Negative for cold intolerance, heat intolerance, polydipsia and polyuria.  Genitourinary:  Negative for dysuria, frequency and urgency.   Musculoskeletal:  Negative for back pain and myalgias.  Skin:  Negative for rash.  Allergic/Immunologic: Negative for environmental allergies.  Neurological:  Negative for dizziness, weakness and headaches.  Psychiatric/Behavioral:  The patient is not nervous/anxious.    Last CBC Lab Results  Component Value Date   WBC 4.1 10/03/2021   HGB 15.9 10/03/2021   HCT 46.8 10/03/2021   MCV 94 10/03/2021   MCH 31.8 10/03/2021   RDW 12.7 10/03/2021   PLT 186 50/01/3817   Last metabolic panel Lab Results  Component Value Date   GLUCOSE 92 10/03/2021   NA 142 10/03/2021   K 4.6 10/03/2021   CL 105 10/03/2021   CO2 24 10/03/2021   BUN 15 10/03/2021   CREATININE 0.97 10/03/2021   EGFR 104 10/03/2021   CALCIUM 9.3 10/03/2021   PROT 7.1 10/03/2021   ALBUMIN 4.6 10/03/2021   LABGLOB 2.5 10/03/2021   AGRATIO 1.8 10/03/2021   BILITOT 0.5 10/03/2021   ALKPHOS 57 10/03/2021   AST 40 10/03/2021   ALT 64 (H) 10/03/2021   Last lipids Lab Results  Component Value Date   CHOL 152 10/03/2021   HDL 63 10/03/2021   LDLCALC 74 10/03/2021   TRIG 80 10/03/2021   CHOLHDL 2.4 10/03/2021   Last hemoglobin A1c Lab Results  Component Value Date   HGBA1C 5.2 10/03/2021   Last thyroid functions Lab Results  Component Value Date   TSH 0.664 10/03/2021      Objective     Today's Vitals   10/10/21 0849  BP: 127/71  Pulse: 74  Temp: 97.9 F (36.6 C)  SpO2: 97%  Weight: 234 lb 12.8 oz (106.5 kg)  Height: 5' 10.08" (1.78 m)   Body mass index is 33.61 kg/m.   BP Readings from Last 3 Encounters:  10/10/21 127/71  07/18/21 129/63  07/02/21 (!) 150/81    Wt Readings from Last 3 Encounters:  10/10/21 234 lb 12.8 oz (106.5 kg)  07/18/21 233 lb 12.8 oz (106.1 kg)  07/02/21 220 lb (99.8 kg)     Physical Exam Vitals and nursing note reviewed.  Constitutional:      Appearance: Normal appearance. He is well-developed.  HENT:     Head: Normocephalic and atraumatic.     Right  Ear: Tympanic membrane, ear canal and external ear normal.     Left Ear: Tympanic membrane, ear canal and external ear normal.     Nose: Nose normal.     Mouth/Throat:     Mouth: Mucous membranes are moist.     Pharynx:  Oropharynx is clear.  Eyes:     Extraocular Movements: Extraocular movements intact.     Conjunctiva/sclera: Conjunctivae normal.     Pupils: Pupils are equal, round, and reactive to light.  Cardiovascular:     Rate and Rhythm: Normal rate and regular rhythm.     Pulses: Normal pulses.     Heart sounds: Normal heart sounds.  Pulmonary:     Effort: Pulmonary effort is normal.     Breath sounds: Normal breath sounds.  Abdominal:     General: Bowel sounds are normal. There is distension.     Palpations: Abdomen is soft. There is no mass.     Tenderness: There is no abdominal tenderness. There is no right CVA tenderness, left CVA tenderness, guarding or rebound.     Hernia: No hernia is present.  Musculoskeletal:        General: Normal range of motion.     Cervical back: Normal range of motion and neck supple.  Lymphadenopathy:     Cervical: No cervical adenopathy.  Skin:    General: Skin is warm and dry.     Capillary Refill: Capillary refill takes less than 2 seconds.  Neurological:     General: No focal deficit present.     Mental Status: He is alert and oriented to person, place, and time.  Psychiatric:        Mood and Affect: Mood normal.        Behavior: Behavior normal.        Thought Content: Thought content normal.        Judgment: Judgment normal.     Last depression screening scores    10/10/2021    8:51 AM 07/18/2021    1:15 PM 09/29/2020    4:15 PM  PHQ 2/9 Scores  PHQ - 2 Score 0 0 0  PHQ- 9 Score 0 1 0   Last fall risk screening    07/18/2021    1:15 PM  [place] in the past year? 0  Number falls in past yr: 0  Injury with Fall? 0  Follow up Falls evaluation completed   Last Audit-C alcohol use screening     View : No data  to display.           Assessment & Plan    1. Encounter for general adult medical examination with abnormal findings Annual physical today.   2. Frequent diarrhea Refer patient to new Owens provider for further evaluation and treatment.  - Ambulatory referral to Gastroenterology  3. Blood in the stool Refer patient to new Owens provider for further evaluation and treatment.  - Ambulatory referral to Gastroenterology  4. Bleeding external hemorrhoids Refer patient to new Owens provider for further evaluation and treatment.  - Ambulatory referral to Gastroenterology  5. Elevated ALT measurement Retest CMP prior to next visit and review results     Immunization History  Administered Date(s) Administered   Influenza-Unspecified 03/24/2016, 04/06/2017, 03/15/2018    Health Maintenance  Topic Date Due   COVID-19 Vaccine (1) Never done   TETANUS/TDAP  07/18/2022 (Originally 07/31/2005)   INFLUENZA VACCINE  12/20/2021   Hepatitis C Screening  Completed   HIV Screening  Completed   HPV VACCINES  Aged Out    Discussed health benefits of physical activity, and encouraged him to engage in regular exercise appropriate for his age and condition.  Problem List Items Addressed This Visit       Cardiovascular and Mediastinum  Bleeding external hemorrhoids   Relevant Orders   Ambulatory referral to Gastroenterology     Other   Blood in the stool   Relevant Orders   Ambulatory referral to Gastroenterology   Other Visit Diagnoses     Frequent diarrhea    -  Primary   Relevant Orders   Ambulatory referral to Gastroenterology        Return in about 6 months (around 04/12/2022) for abdominal issues. - check CMP, HgbA1c a week prior to visit. Marland Kitchen        Dennis Freshwater, NP  [facility] at [facility] [phone] (phone) [phone] (fax)  [place]

## 2022-02-14 ENCOUNTER — Encounter: Payer: Self-pay | Admitting: Gastroenterology

## 2022-02-14 ENCOUNTER — Other Ambulatory Visit: Payer: Self-pay

## 2022-02-14 ENCOUNTER — Ambulatory Visit (INDEPENDENT_AMBULATORY_CARE_PROVIDER_SITE_OTHER): Payer: BC Managed Care – PPO | Admitting: Gastroenterology

## 2022-02-14 ENCOUNTER — Ambulatory Visit: Payer: BC Managed Care – PPO | Admitting: Gastroenterology

## 2022-02-14 VITALS — BP 153/94 | HR 71 | Temp 99.1°F | Ht 71.0 in | Wt 235.8 lb

## 2022-02-14 DIAGNOSIS — K648 Other hemorrhoids: Secondary | ICD-10-CM

## 2022-02-14 DIAGNOSIS — K625 Hemorrhage of anus and rectum: Secondary | ICD-10-CM | POA: Diagnosis not present

## 2022-02-14 DIAGNOSIS — R195 Other fecal abnormalities: Secondary | ICD-10-CM | POA: Diagnosis not present

## 2022-02-14 MED ORDER — PEG 3350-KCL-NA BICARB-NACL 420 G PO SOLR: ORAL | 0 refills | Status: DC

## 2022-02-14 NOTE — Addendum Note (Signed)
Addended by: Wayna Chalet on: 02/14/2022 02:25 PM   Modules accepted: Orders

## 2022-02-14 NOTE — Progress Notes (Signed)
Dennis Bellows MD, MRCP(U.K) [address]  [place]  [place], [place] [address]  Main: [phone]  Fax: [phone]   Gastroenterology Consultation  Referring Provider:     Ronnell Freshwater, Dennis Owens Primary Care Physician:  Dennis Freshwater, Dennis Owens Primary Gastroenterologist:  Dr. Jonathon Owens  Reason for Consultation:    Diarrhea        HPI:   Dennis Owens is a 36 y.o. y/o male referred for consultation & management  by  Dennis Freshwater, Dennis Owens.     He has been previously seen by Dr. Fuller Plan at [facility] gastroenterology in July 2022 for hemorrhoids, rectal bleeding, loose stools previous colonoscopy back in 2015 showed inflammation in the anal canal and internal hemorrhoids.  He states that he has been having diarrhea for many years.  1-2 bowel movements per day.  Sometimes watery the consistency of bowel and sometimes formed.  Sees blood on the tissue paper toilet bowl and on the stools.  Denies any NSAID use.  No family history of inflammatory bowel disease.  Has issues with urgency.  Stress does make his symptoms worse.  Denies any consumption of artificial sugars or sweeteners.  No unintentional weight loss.  No family history of colon cancer.  Diet is low in fiber.  Past Medical History:  Diagnosis Date   Anal fissure    Internal hemorrhoids    Milk intolerance     Past Surgical History:  Procedure Laterality Date   VASECTOMY N/A    Phreesia 09/26/2020   WISDOM TOOTH EXTRACTION      Prior to Admission medications   Not on File    Family History  Problem Relation Age of Onset   Healthy Mother    Healthy Father    Alcoholism Father    Healthy Sister    Healthy Brother    Healthy Daughter    Healthy Son    Diabetes Maternal Grandmother    Healthy Brother    Colon cancer Neg Hx    Stomach cancer Neg Hx    Rectal cancer Neg Hx    Esophageal cancer Neg Hx    Liver cancer Neg Hx      Social History   Tobacco Use   Smoking status: Never   Smokeless tobacco:  Never  Substance Use Topics   Alcohol use: Not Currently    Comment: occassionally   Drug use: No    Allergies as of 02/14/2022 - Review Complete 02/14/2022  Allergen Reaction Noted   Penicillins Rash 11/29/2011    Review of Systems:    All systems reviewed and negative except where noted in HPI.   Physical Exam:  BP (!) 153/94   Pulse 71   Temp 99.1 F (37.3 C) (Oral)   Ht 5\' 11"  (1.803 m)   Wt 235 lb 12.8 oz (107 kg)   BMI 32.89 kg/m  No LMP for male patient. Psych:  Alert and cooperative. Normal mood and affect. General:   Alert,  Well-developed, well-nourished, pleasant and cooperative in NAD Head:  Normocephalic and atraumatic. Eyes:  Sclera clear, no icterus.   Conjunctiva pink. Neurologic:  Alert and oriented x3;  grossly normal neurologically. Psych:  Alert and cooperative. Normal mood and affect.  Imaging Studies: No results found.  Assessment and Plan:   Dennis Owens is a 36 y.o. y/o male has been referred for chronic diarrhea, rectal bleeding, urgency.  Explained by differential diagnosis ranging from irritable bowel syndrome inflammatory bowel  disease.  Plan 1.  Colonoscopy 2.  Stool studies, celiac serology, CRP, fecal calprotectin 3.  High-fiber diet patient information be provided about high-fiber diet aim to get about 30 g of fiber per day 4.  If only hemorrhoids are found to be the cause of his bleeding then will perform banding of internal hemorrhoids at next visit  I have discussed alternative options, risks & benefits,  which include, but are not limited to, bleeding, infection, perforation,respiratory complication & drug reaction.  The patient agrees with this plan & written consent will be obtained.     Follow up in 4 to 6 weeks  Dr Wyline Mood MD,MRCP(U.K)

## 2022-02-14 NOTE — Addendum Note (Signed)
Addended by: Lurlean Nanny on: 02/14/2022 02:23 PM   Modules accepted: Orders

## 2022-02-17 LAB — CELIAC DISEASE AB SCREEN W/RFX
Antigliadin Abs, IgA: 5 units (ref 0–19)
IgA/Immunoglobulin A, Serum: 130 mg/dL (ref 90–386)
Transglutaminase IgA: <2 U/mL (ref 0–3)

## 2022-02-17 LAB — C-REACTIVE PROTEIN: CRP: 1 mg/L (ref 0–10)

## 2022-03-03 LAB — C DIFFICILE, CYTOTOXIN B

## 2022-03-03 LAB — C DIFFICILE TOXINS A+B W/RFLX: C difficile Toxins A+B, EIA: NEGATIVE

## 2022-03-05 LAB — GI PROFILE, STOOL, PCR

## 2022-03-05 LAB — CALPROTECTIN, FECAL: Calprotectin, Fecal: 7 ug/g (ref 0–120)

## 2022-03-27 ENCOUNTER — Encounter: Payer: Self-pay | Admitting: Gastroenterology

## 2022-03-28 ENCOUNTER — Encounter: Payer: Self-pay | Admitting: Gastroenterology

## 2022-03-28 ENCOUNTER — Encounter
Admission: RE | Disposition: A | Discharge status: Home / Self Care | Payer: Self-pay | Source: Home / Self Care | Attending: Gastroenterology

## 2022-03-28 ENCOUNTER — Ambulatory Visit: Payer: BC Managed Care – PPO | Admitting: Certified Registered"

## 2022-03-28 ENCOUNTER — Ambulatory Visit
Admission: RE | Admit: 2022-03-28 | Discharge: 2022-03-28 | Disposition: A | Discharge status: Home / Self Care | Payer: BC Managed Care – PPO | Attending: Gastroenterology | Admitting: Gastroenterology

## 2022-03-28 DIAGNOSIS — Z9852 Vasectomy status: Secondary | ICD-10-CM | POA: Insufficient documentation

## 2022-03-28 DIAGNOSIS — R197 Diarrhea, unspecified: Secondary | ICD-10-CM | POA: Diagnosis not present

## 2022-03-28 DIAGNOSIS — K640 First degree hemorrhoids: Secondary | ICD-10-CM | POA: Insufficient documentation

## 2022-03-28 DIAGNOSIS — K625 Hemorrhage of anus and rectum: Secondary | ICD-10-CM | POA: Diagnosis present

## 2022-03-28 HISTORY — PX: COLONOSCOPY WITH PROPOFOL: SHX5780

## 2022-03-28 SURGERY — COLONOSCOPY WITH PROPOFOL
Anesthesia: General

## 2022-03-28 MED ORDER — LIDOCAINE HCL (CARDIAC) PF 100 MG/5ML IV SOSY
PREFILLED_SYRINGE | INTRAVENOUS | Status: DC | PRN
  Administered 2022-03-28: 100 mg via INTRAVENOUS

## 2022-03-28 MED ORDER — MIDAZOLAM HCL 2 MG/2ML IJ SOLN
INTRAMUSCULAR | Status: DC | PRN
  Administered 2022-03-28: 2 mg via INTRAVENOUS

## 2022-03-28 MED ORDER — SODIUM CHLORIDE 0.9 % IV SOLN: INTRAVENOUS | Status: DC | PRN

## 2022-03-28 MED ORDER — PROPOFOL 500 MG/50ML IV EMUL
INTRAVENOUS | Status: DC | PRN
  Administered 2022-03-28: 165 ug/kg/min via INTRAVENOUS

## 2022-03-28 MED ORDER — SIMETHICONE 40 MG/0.6ML PO SUSP
ORAL | Status: DC | PRN
  Administered 2022-03-28: 120 mL

## 2022-03-28 MED ORDER — MIDAZOLAM HCL 2 MG/2ML IJ SOLN
INTRAMUSCULAR | Status: AC
  Filled 2022-03-28: qty 2

## 2022-03-28 MED ORDER — DEXMEDETOMIDINE HCL IN NACL 200 MCG/50ML IV SOLN
INTRAVENOUS | Status: DC | PRN
  Administered 2022-03-28: 12 ug via INTRAVENOUS

## 2022-03-28 MED ORDER — LIDOCAINE HCL (PF) 1 % IJ SOLN
INTRAMUSCULAR | Status: AC
  Filled 2022-03-28: qty 2

## 2022-03-28 MED ORDER — PROPOFOL 10 MG/ML IV BOLUS
INTRAVENOUS | Status: DC | PRN
  Administered 2022-03-28: 30 mg via INTRAVENOUS
  Administered 2022-03-28: 70 mg via INTRAVENOUS

## 2022-03-28 NOTE — Anesthesia Postprocedure Evaluation (Signed)
Anesthesia Post Note  Patient: Dennis Owens  Procedure(s) Performed: COLONOSCOPY WITH PROPOFOL  Anesthetic complications: no   No notable events documented.   Last Vitals:  Vitals:   03/28/22 1127 03/28/22 1137  BP: 118/75   Pulse: 60 67  Resp:    Temp:    SpO2: 100% 100%    Last Pain:  Vitals:   03/28/22 1137  TempSrc:   PainSc: 0-No pain                 Molli Barrows

## 2022-03-28 NOTE — H&P (Signed)
Dennis Bellows, MD [address] [place], [place], [place], [address] [address], [place], [place], [place], [address] Phone: [phone]  Fax: [phone]  Primary Care Physician:  Ronnell Freshwater, NP   Pre-Procedure History & Physical: HPI:  Dennis Owens is a 36 y.o. male is here for an colonoscopy.   Past Medical History:  Diagnosis Date   Anal fissure    Internal hemorrhoids    Milk intolerance     Past Surgical History:  Procedure Laterality Date   VASECTOMY N/A    Phreesia 09/26/2020   WISDOM TOOTH EXTRACTION      Prior to Admission medications   Medication Sig Start Date End Date Taking? Authorizing Provider  polyethylene glycol-electrolytes (NULYTELY) 420 g solution Prepare according to package instructions. Starting at 5:00 PM: Drink one 8 oz glass of mixture every 15 minutes until you finish half of the jug. Five hours prior to procedure, drink 8 oz glass of mixture every 15 minutes until it is all gone. Make sure you do not drink anything 4 hours prior to your procedure. 02/14/22  Yes Dennis Bellows, MD    Allergies as of 02/15/2022 - Review Complete 02/14/2022  Allergen Reaction Noted   Penicillins Rash 11/29/2011    Family History  Problem Relation Age of Onset   Healthy Mother    Healthy Father    Alcoholism Father    Healthy Sister    Healthy Brother    Healthy Daughter    Healthy Son    Diabetes Maternal Grandmother    Healthy Brother    Colon cancer Neg Hx    Stomach cancer Neg Hx    Rectal cancer Neg Hx    Esophageal cancer Neg Hx    Liver cancer Neg Hx     Social History   Socioeconomic History   Marital status: Married    Spouse name: Not on file   Number of children: 2   Years of education: Not on file   Highest education level: Not on file  Occupational History   Not on file  Tobacco Use   Smoking status: Never   Smokeless tobacco: Never  Vaping Use   Vaping Use: Never used  Substance and Sexual Activity   Alcohol  use: Not Currently    Comment: occassionally   Drug use: No   Sexual activity: Yes    Birth control/protection: Condom  Other Topics Concern   Not on file  Social History Narrative   Not on file   Social Determinants of Health   Financial Resource Strain: Not on file  Food Insecurity: Not on file  Transportation Needs: Not on file  Physical Activity: Not on file  Stress: Not on file  Social Connections: Not on file  Intimate Partner Violence: Not on file    Review of Systems: See HPI, otherwise negative ROS  Physical Exam: BP (!) 124/93   Pulse 67   Temp (!) 96.7 F (35.9 C) (Temporal)   Resp 18   Ht 5\' 11"  (1.803 m)   Wt 106.8 kg   SpO2 99%   BMI 32.84 kg/m  General:   Alert,  pleasant and cooperative in NAD Head:  Normocephalic and atraumatic. Neck:  Supple; no masses or thyromegaly. Lungs:  Clear throughout to auscultation, normal respiratory effort.    Heart:  +S1, +S2, Regular rate and rhythm, No edema. Abdomen:  Soft, nontender and nondistended. Normal bowel sounds, without guarding, and without rebound.  Neurologic:  Alert and  oriented x4;  grossly normal neurologically.  Impression/Plan: Dennis Owens is here for an colonoscopy to be performed for rectal bleeding.  Risks, benefits, limitations, and alternatives regarding  colonoscopy have been reviewed with the patient.  Questions have been answered.  All parties agreeable.   Wyline Mood, MD  03/28/2022, 10:24 AM

## 2022-03-28 NOTE — Transfer of Care (Signed)
Immediate Anesthesia Transfer of Care Note  Patient: DOLORES MCGOVERN II  Procedure(s) Performed: COLONOSCOPY WITH PROPOFOL  Patient Location: Endoscopy Unit  Anesthesia Type:General  Level of Consciousness: drowsy and patient cooperative  Airway & Oxygen Therapy: Patient Spontanous Breathing and Patient connected to face mask oxygen  Post-op Assessment: Report given to RN and Post -op Vital signs reviewed and stable  Post vital signs: Reviewed and stable  Last Vitals:  Vitals Value Taken Time  BP    Temp    Pulse 71 03/28/22 1059  Resp 16 03/28/22 1059  SpO2 100 % 03/28/22 1059  Vitals shown include unvalidated device data.  Last Pain:  Vitals:   03/28/22 0955  TempSrc: Temporal  PainSc: 0-No pain         Complications: No notable events documented.

## 2022-03-28 NOTE — Anesthesia Procedure Notes (Signed)
Procedure Name: General with mask airway Date/Time: 03/28/2022 10:45 AM  Performed by: Kelton Pillar, CRNAPre-anesthesia Checklist: Emergency Drugs available, Suction available and Patient being monitored Patient Re-evaluated:Patient Re-evaluated prior to induction Oxygen Delivery Method: Simple face mask Induction Type: IV induction Placement Confirmation: positive ETCO2, CO2 detector and breath sounds checked- equal and bilateral Dental Injury: Teeth and Oropharynx as per pre-operative assessment

## 2022-03-28 NOTE — Anesthesia Preprocedure Evaluation (Signed)
Anesthesia Evaluation  Patient identified by MRN, date of birth, ID band Patient awake    Reviewed: Allergy & Precautions, H&P , NPO status , Patient's Chart, lab work & pertinent test results, reviewed documented beta blocker date and time   Airway Mallampati: II   Neck ROM: full    Dental  (+) Poor Dentition   Pulmonary neg pulmonary ROS   Pulmonary exam normal        Cardiovascular negative cardio ROS Normal cardiovascular exam Rhythm:regular Rate:Normal     Neuro/Psych negative neurological ROS  negative psych ROS   GI/Hepatic negative GI ROS, Neg liver ROS,,,  Endo/Other  negative endocrine ROS    Renal/GU negative Renal ROS  negative genitourinary   Musculoskeletal   Abdominal   Peds  Hematology negative hematology ROS (+)   Anesthesia Other Findings Past Medical History: No date: Anal fissure No date: Internal hemorrhoids No date: Milk intolerance Past Surgical History: No date: VASECTOMY; N/A     Comment:  Phreesia 09/26/2020 No date: WISDOM TOOTH EXTRACTION BMI    Body Mass Index: 32.84 kg/m     Reproductive/Obstetrics negative OB ROS                             Anesthesia Physical Anesthesia Plan  ASA: 2  Anesthesia Plan: General   Post-op Pain Management:    Induction:   PONV Risk Score and Plan:   Airway Management Planned:   Additional Equipment:   Intra-op Plan:   Post-operative Plan:   Informed Consent: I have reviewed the patients History and Physical, chart, labs and discussed the procedure including the risks, benefits and alternatives for the proposed anesthesia with the patient or authorized representative who has indicated his/her understanding and acceptance.     Dental Advisory Given  Plan Discussed with: CRNA  Anesthesia Plan Comments:        Anesthesia Quick Evaluation

## 2022-03-28 NOTE — Op Note (Signed)
[facility] Patient Name: Dennis Owens Procedure Date: 03/28/2022 10:21 AM MRN: [id number] Account #: [id number] Date of Birth: Feb 25, 1986 Admit Type: Outpatient Age: 36 Room: [facility] ENDO ROOM 2 Gender: Male Note Status: Finalized Instrument Name: Prentice Docker [id number] Procedure:             Colonoscopy Indications:           Rectal bleeding Providers:             Wyline Mood MD, MD Referring MD:          No Local Md, MD (Referring MD) Medicines:             Monitored Anesthesia Care Complications:         No immediate complications. Procedure:             Pre-Anesthesia Assessment:                        - Prior to the procedure, a History and Physical was                         performed, and patient medications, allergies and                         sensitivities were reviewed. The patient's tolerance                         of previous anesthesia was reviewed.                        - The risks and benefits of the procedure and the                         sedation options and risks were discussed with the                         patient. All questions were answered and informed                         consent was obtained.                        - ASA Grade Assessment: II - A patient with mild                         systemic disease.                        After obtaining informed consent, the colonoscope was                         passed under direct vision. Throughout the procedure,                         the patient's blood pressure, pulse, and oxygen                         saturations were monitored continuously. The                         Colonoscope was introduced through the  anus and                         advanced to the the cecum, identified by the                         appendiceal orifice. The colonoscopy was performed                         with ease. The patient tolerated the procedure well.                         The quality  of the bowel preparation was excellent.                         The appendiceal orifice was photographed. Findings:      The perianal and digital rectal examinations were normal.      The terminal ileum appeared normal. Biopsies were taken with a cold       forceps for histology.      Normal mucosa was found in the entire colon. Biopsies were taken with a       cold forceps for histology.      Non-bleeding internal hemorrhoids were found during retroflexion. The       hemorrhoids were large and Grade I (internal hemorrhoids that do not       prolapse).      The exam was otherwise without abnormality on direct and retroflexion       views. Impression:            - The examined portion of the ileum was normal.                         Biopsied.                        - Normal mucosa in the entire examined colon. Biopsied.                        - Non-bleeding internal hemorrhoids.                        - The examination was otherwise normal on direct and                         retroflexion views. Recommendation:        - Discharge patient to home (with escort).                        - Resume previous diet.                        - Continue present medications.                        - Await pathology results.                        - Repeat colonoscopy in 10 years for screening                         purposes.                        -  Return to GI office in 2 weeks.                        - for hemorroidal banding Procedure Code(s):     --- Professional ---                        [phone], Colonoscopy, flexible; with biopsy, single or                         multiple Diagnosis Code(s):     --- Professional ---                        K64.0, First degree hemorrhoids                        K62.5, Hemorrhage of anus and rectum CPT copyright 2022 American Medical Association. All rights reserved. The codes documented in this report are preliminary and upon coder review may  be revised to  meet current compliance requirements. [name], MD [name], MD 03/28/2022 10:55:48 AM This report has been signed electronically. Number of Addenda: 0 Note Initiated On: 03/28/2022 10:21 AM Scope Withdrawal Time: 0 hours 8 minutes 2 seconds  Total Procedure Duration: 0 hours 10 minutes 39 seconds  Estimated Blood Loss:  Estimated blood loss: none.      [facility]

## 2022-03-29 ENCOUNTER — Encounter: Payer: Self-pay | Admitting: Gastroenterology

## 2022-03-29 LAB — SURGICAL PATHOLOGY

## 2022-03-30 ENCOUNTER — Other Ambulatory Visit: Payer: Self-pay

## 2022-03-30 ENCOUNTER — Encounter: Payer: Self-pay | Admitting: Gastroenterology

## 2022-03-30 ENCOUNTER — Ambulatory Visit (INDEPENDENT_AMBULATORY_CARE_PROVIDER_SITE_OTHER): Payer: BC Managed Care – PPO | Admitting: Gastroenterology

## 2022-03-30 ENCOUNTER — Ambulatory Visit: Payer: BC Managed Care – PPO | Admitting: Gastroenterology

## 2022-03-30 VITALS — BP 127/77 | HR 67 | Temp 98.7°F | Wt 231.8 lb

## 2022-03-30 DIAGNOSIS — K625 Hemorrhage of anus and rectum: Secondary | ICD-10-CM

## 2022-03-30 DIAGNOSIS — K602 Anal fissure, unspecified: Secondary | ICD-10-CM | POA: Diagnosis not present

## 2022-03-30 DIAGNOSIS — K580 Irritable bowel syndrome with diarrhea: Secondary | ICD-10-CM | POA: Diagnosis not present

## 2022-03-30 DIAGNOSIS — Z130 Encounter for screening for diseases of the blood and blood-forming organs and certain disorders involving the immune mechanism: Secondary | ICD-10-CM

## 2022-03-30 DIAGNOSIS — R195 Other fecal abnormalities: Secondary | ICD-10-CM | POA: Diagnosis not present

## 2022-03-30 DIAGNOSIS — Z0000 Encounter for general adult medical examination without abnormal findings: Secondary | ICD-10-CM

## 2022-03-30 MED ORDER — RIFAXIMIN 550 MG PO TABS: 550.0000 mg | ORAL_TABLET | Freq: Three times a day (TID) | ORAL | 0 refills | Status: DC

## 2022-03-30 MED ORDER — RIFAXIMIN 550 MG PO TABS: 550.0000 mg | ORAL_TABLET | Freq: Three times a day (TID) | ORAL | 0 refills | Status: AC

## 2022-03-30 NOTE — Progress Notes (Signed)
Patient follow-ups today for banding of hemorrhoids    Summary of history :  She referred and seen on 02/14/2022 for diarrhea and blood on the tissue paper and toilet bowl.  The diarrhea been ongoing for many years. 02/14/2022 celiac serology, stool for GI PCR and C. difficile were negative CRP and fecal calprotectin were normal.  Under went a colonoscopy which showed normal terminal ileum and colonic mucosa biopsies showed no evidence of any inflammation.  Nonbleeding large internal hemorrhoids were noted.  Continues to have on and off bleeding would like to get more definitive therapy.  Continues to have diarrhea.   Digital rectal exam performed in the presence of a chaperone. External anal findings: Prolapsing hemorrhoids Internal findings: Discomfort on insertion of the index finger in the posterior aspect of the anus and could not perform proctoscopy due to discomfort, No masses, no blood on glove noticed.     Plan:  We will commence on Xifaxan for IBS diarrhea for 2 weeks, low FODMAP diet Likely has an anal fissure will treat with topical nifedipine and once heals we will perform hemorrhoidal banding  Follow-up: 4 weeks  Dr Wyline Mood MD,MRCP [facility] Gastroenterology/Hepatology Pager: [phone]

## 2022-03-30 NOTE — Patient Instructions (Addendum)
We have sent a prescription (ointment) to [facility]. Please give them until tomorrow to pick up. Address: [address], [place], [place] [address] Phone: [phone]  Low-FODMAP Eating Plan  FODMAP stands for fermentable oligosaccharides, disaccharides, monosaccharides, and polyols. These are sugars that are hard for some people to digest. A low-FODMAP eating plan may help some people who have irritable bowel syndrome (IBS) and certain other bowel (intestinal) diseases to manage their symptoms. This meal plan can be complicated to follow. Work with a diet and nutrition specialist (dietitian) to make a low-FODMAP eating plan that is right for you. A dietitian can help make sure that you get enough nutrition from this diet. What are tips for following this plan? Reading food labels Check labels for hidden FODMAPs such as: High-fructose syrup. Honey. Agave. Natural fruit flavors. Onion or garlic powder. Choose low-FODMAP foods that contain 3-4 grams of fiber per serving. Check food labels for serving sizes. Eat only one serving at a time to make sure FODMAP levels stay low. Shopping Shop with a list of foods that are recommended on this diet and make a meal plan. Meal planning Follow a low-FODMAP eating plan for up to 6 weeks, or as told by your health care provider or dietitian. To follow the eating plan: Eliminate high-FODMAP foods from your diet completely. Choose only low-FODMAP foods to eat. You will do this for 2-6 weeks. Gradually reintroduce high-FODMAP foods into your diet one at a time. Most people should wait a few days before introducing the next new high-FODMAP food into their meal plan. Your dietitian can recommend how quickly you may reintroduce foods. Keep a daily record of what and how much you eat and drink. Make note of any symptoms that you have after eating. Review your daily record with a dietitian regularly to identify which foods you can eat and which foods you should  avoid. General tips Drink enough fluid each day to keep your urine pale yellow. Avoid processed foods. These often have added sugar and may be high in FODMAPs. Avoid most dairy products, whole grains, and sweeteners. Work with a dietitian to make sure you get enough fiber in your diet. Avoid high FODMAP foods at meals to manage symptoms. Recommended foods Fruits Bananas, oranges, tangerines, lemons, limes, blueberries, raspberries, strawberries, grapes, cantaloupe, honeydew melon, kiwi, papaya, passion fruit, and pineapple. Limited amounts of dried cranberries, banana chips, and shredded coconut. Vegetables Eggplant, zucchini, cucumber, peppers, green beans, bean sprouts, lettuce, arugula, kale, Swiss chard, spinach, collard greens, bok choy, summer squash, potato, and tomato. Limited amounts of corn, carrot, and sweet potato. Green parts of scallions. Grains Gluten-free grains, such as rice, oats, buckwheat, quinoa, corn, polenta, and millet. Gluten-free pasta, bread, or cereal. Rice noodles. Corn tortillas. Meats and other proteins Unseasoned beef, pork, poultry, or fish. Eggs. Tomasa Blase. Tofu (firm) and tempeh. Limited amounts of nuts and seeds, such as almonds, walnuts, [place] nuts, pecans, peanuts, nut butters, pumpkin seeds, chia seeds, and sunflower seeds. Dairy Lactose-free milk, yogurt, and kefir. Lactose-free cottage cheese and ice cream. Non-dairy milks, such as almond, coconut, hemp, and rice milk. Non-dairy yogurt. Limited amounts of goat cheese, brie, mozzarella, parmesan, swiss, and other hard cheeses. Fats and oils Butter-free spreads. Vegetable oils, such as olive, canola, and sunflower oil. Seasoning and other foods Artificial sweeteners with names that do not end in "ol," such as aspartame, saccharine, and stevia. Maple syrup, white table sugar, raw sugar, brown sugar, and molasses. Mayonnaise, soy sauce, and tamari. Fresh  basil, coriander, parsley, rosemary, and  thyme. Beverages Water and mineral water. Sugar-sweetened soft drinks. Small amounts of orange juice or cranberry juice. Black and green tea. Most dry wines. Coffee. The items listed above may not be a complete list of foods and beverages you can eat. Contact a dietitian for more information. Foods to avoid Fruits Fresh, dried, and juiced forms of apple, pear, watermelon, peach, plum, cherries, apricots, blackberries, boysenberries, figs, nectarines, and mango. Avocado. Vegetables Chicory root, artichoke, asparagus, cabbage, snow peas, Brussels sprouts, broccoli, sugar snap peas, mushrooms, celery, and cauliflower. Onions, garlic, leeks, and the white part of scallions. Grains Wheat, including kamut, durum, and semolina. Barley and bulgur. Couscous. Wheat-based cereals. Wheat noodles, bread, crackers, and pastries. Meats and other proteins Fried or fatty meat. Sausage. Cashews and pistachios. Soybeans, baked beans, black beans, chickpeas, kidney beans, fava beans, navy beans, lentils, black-eyed peas, and split peas. Dairy Milk, yogurt, ice cream, and soft cheese. Cream and sour cream. Milk-based sauces. Custard. Buttermilk. Soy milk. Seasoning and other foods Any sugar-free gum or candy. Foods that contain artificial sweeteners such as sorbitol, mannitol, isomalt, or xylitol. Foods that contain honey, high-fructose corn syrup, or agave. Bouillon, vegetable stock, beef stock, and chicken stock. Garlic and onion powder. Condiments made with onion, such as hummus, chutney, pickles, relish, salad dressing, and salsa. Tomato paste. Beverages Chicory-based drinks. Coffee substitutes. Chamomile tea. Fennel tea. Sweet or fortified wines such as port or sherry. Diet soft drinks made with isomalt, mannitol, maltitol, sorbitol, or xylitol. Apple, pear, and mango juice. Juices with high-fructose corn syrup. The items listed above may not be a complete list of foods and beverages you should avoid. Contact a  dietitian for more information. Summary FODMAP stands for fermentable oligosaccharides, disaccharides, monosaccharides, and polyols. These are sugars that are hard for some people to digest. A low-FODMAP eating plan is a short-term diet that helps to ease symptoms of certain bowel diseases. The eating plan usually lasts up to 6 weeks. After that, high-FODMAP foods are reintroduced gradually and one at a time. This can help you find out which foods may be causing symptoms. A low-FODMAP eating plan can be complicated. It is best to work with a dietitian who has experience with this type of plan. This information is not intended to replace advice given to you by your health care provider. Make sure you discuss any questions you have with your health care provider. Document Revised: 09/25/2019 Document Reviewed: 09/25/2019 Elsevier Patient Education  2023 [employer].

## 2022-03-30 NOTE — Addendum Note (Signed)
Addended by: Adela Ports on: 03/30/2022 04:45 PM   Modules accepted: Orders

## 2022-04-03 ENCOUNTER — Other Ambulatory Visit: Payer: BC Managed Care – PPO

## 2022-04-03 DIAGNOSIS — Z130 Encounter for screening for diseases of the blood and blood-forming organs and certain disorders involving the immune mechanism: Secondary | ICD-10-CM

## 2022-04-03 DIAGNOSIS — Z0000 Encounter for general adult medical examination without abnormal findings: Secondary | ICD-10-CM

## 2022-04-04 LAB — COMPREHENSIVE METABOLIC PANEL
ALT: 47 IU/L — ABNORMAL HIGH (ref 0–44)
AST: 34 IU/L (ref 0–40)
Albumin/Globulin Ratio: 2 (ref 1.2–2.2)
Albumin: 4.5 g/dL (ref 4.1–5.1)
Alkaline Phosphatase: 66 IU/L (ref 44–121)
BUN/Creatinine Ratio: 14 (ref 9–20)
BUN: 14 mg/dL (ref 6–20)
Bilirubin Total: 0.6 mg/dL (ref 0.0–1.2)
CO2: 24 mmol/L (ref 20–29)
Calcium: 9.3 mg/dL (ref 8.7–10.2)
Chloride: 105 mmol/L (ref 96–106)
Creatinine, Ser: 1 mg/dL (ref 0.76–1.27)
Globulin, Total: 2.2 g/dL (ref 1.5–4.5)
Glucose: 89 mg/dL (ref 70–99)
Potassium: 4.6 mmol/L (ref 3.5–5.2)
Sodium: 143 mmol/L (ref 134–144)
Total Protein: 6.7 g/dL (ref 6.0–8.5)
eGFR: 101 mL/min/{1.73_m2} (ref >59)

## 2022-04-04 LAB — HEMOGLOBIN A1C
Est. average glucose Bld gHb Est-mCnc: 103 mg/dL
Hgb A1c MFr Bld: 5.2 % (ref 4.8–5.6)

## 2022-04-09 NOTE — Progress Notes (Addendum)
Established patient visit   Patient: Dennis Owens   DOB: January 05, 1986   36 y.o. Male  MRN: [id number] Visit Date: 04/10/2022  Virtual Visit via Telephone Note  I connected with Dennis Owens on 04/10/22 at  8:10 AM EST by telephone and verified that I am speaking with the correct person using two identifiers.  Location: Patient: work Provider: [place] [facility] at [facility]     I discussed the limitations, risks, security and privacy concerns of performing an evaluation and management service by telephone and the availability of in person appointments. I also discussed with the patient that there may be a patient responsible charge related to this service. The patient expressed understanding and agreed to proceed.   Chief Complaint  Patient presents with   Irritable Bowel Syndrome   Subjective    HPI  Follow up  -recheck of CMP  --elevation of ALT, though improved from previous check.  -recheck HgbA1c  --normal -having issues with rectal bleeding and frequently having diarrhea. --has had colonoscopy  --seeing GI provider routinely ---diagnosed with IBS with diarrhea  --he is now on Xifaxin for next few weeks.  ---has noted improvement already  --willgo back to see GI to have internal hemorrhoids removed at a later date . -no new concerns or complaints today      Medications: Outpatient Medications Prior to Visit  Medication Sig   XIFAXAN 550 MG TABS tablet Take 550 mg by mouth 3 (three) times daily.   No facility-administered medications prior to visit.    Review of Systems  Gastrointestinal:  Positive for blood in stool and diarrhea.       Patient with daily episodes of diarrhea.  Will have just a litte cramping then states that he has to use the bathroom right then. Has had colonoscopy in 2015 showing internal hemorrhoids and inflammatory changes of the anal canal and rectum. He did have a repeat colonoscopy in 2018, however, I cannot pull that report.  He does see [facility] here. Lat visit was 1 to 2 years ago. States that new colonoscopy has not been recommended yet. He is following a high fiber diet. He does not take pre or probiotics. He states that hemorrhoids bleed and he notes blood in his stool everyday.   Repeat colonoscopy done 03/28/2022 showed the examined portion of the ileum was normal. Biopsied.- Normal mucosa in the entire examined colon. Biopsied. - Non-bleeding internal hemorrhoids He does report improvement in symptoms since seeing GI provider and starting xifaxin   All other systems reviewed and are negative.   Last CBC Lab Results  Component Value Date   WBC 4.1 10/03/2021   HGB 15.9 10/03/2021   HCT 46.8 10/03/2021   MCV 94 10/03/2021   MCH 31.8 10/03/2021   RDW 12.7 10/03/2021   PLT 186 03/83/3383   Last metabolic panel Lab Results  Component Value Date   GLUCOSE 89 04/03/2022   NA 143 04/03/2022   K 4.6 04/03/2022   CL 105 04/03/2022   CO2 24 04/03/2022   BUN 14 04/03/2022   CREATININE 1.00 04/03/2022   EGFR 101 04/03/2022   CALCIUM 9.3 04/03/2022   PROT 6.7 04/03/2022   ALBUMIN 4.5 04/03/2022   LABGLOB 2.2 04/03/2022   AGRATIO 2.0 04/03/2022   BILITOT 0.6 04/03/2022   ALKPHOS 66 04/03/2022   AST 34 04/03/2022   ALT 47 (H) 04/03/2022   Last lipids Lab Results  Component Value Date   CHOL 152  10/03/2021   HDL 63 10/03/2021   LDLCALC 74 10/03/2021   TRIG 80 10/03/2021   CHOLHDL 2.4 10/03/2021   Last hemoglobin A1c Lab Results  Component Value Date   HGBA1C 5.2 04/03/2022   Last thyroid functions Lab Results  Component Value Date   TSH 0.664 10/03/2021       Objective     Today's Vitals   04/10/22 0812  Weight: 230 lb (104.3 kg)  Height: 5' 10.08" (1.78 m)   Body mass index is 32.93 kg/m.  BP Readings from Last 3 Encounters:  03/30/22 127/77  03/28/22 118/75  02/14/22 (Abnormal) 153/94    Wt Readings from Last 3 Encounters:  04/10/22 230 lb (104.3 kg)  03/30/22 231 lb  12.8 oz (105.1 kg)  03/28/22 235 lb 7.6 oz (106.8 kg)    Physical Exam   The patient is alert and oriented. He is pleasant and answering all questions appropriately. Breathing is non-labored. He is in no acute distress.      Assessment & Plan    1. Irritable bowel syndrome with diarrhea Improving. Patient now on xifaxin twice daily per GI. Will follow up with them as scheduled.   2. Internal hemorrhoids Patient states he will be returning to GI for removal of some internal hemorrhoids.    Problem List Items Addressed This Visit       Cardiovascular and Mediastinum   Internal hemorrhoids     Digestive   Irritable bowel syndrome with diarrhea - Primary     Return in about 6 months (around 10/09/2022) for health maintenance exam, FBW a week prior to visit.      Time spent with patient included reviewing progress notes, labs, imaging studies, and discussing plan for follow up was 15 minutes.    Ronnell Freshwater, NP  [facility] at [facility] [phone] (phone) [phone] (fax)  [place]

## 2022-04-10 ENCOUNTER — Encounter: Payer: Self-pay | Admitting: Nurse Practitioner

## 2022-04-10 ENCOUNTER — Ambulatory Visit (INDEPENDENT_AMBULATORY_CARE_PROVIDER_SITE_OTHER): Payer: BC Managed Care – PPO | Admitting: Nurse Practitioner

## 2022-04-10 VITALS — Ht 70.08 in | Wt 230.0 lb

## 2022-04-10 DIAGNOSIS — K580 Irritable bowel syndrome with diarrhea: Secondary | ICD-10-CM | POA: Insufficient documentation

## 2022-04-10 DIAGNOSIS — K648 Other hemorrhoids: Secondary | ICD-10-CM | POA: Diagnosis not present

## 2022-05-11 ENCOUNTER — Ambulatory Visit: Payer: BC Managed Care – PPO | Admitting: Gastroenterology

## 2022-07-24 ENCOUNTER — Ambulatory Visit: Payer: BC Managed Care – PPO | Admitting: Gastroenterology

## 2022-08-08 ENCOUNTER — Ambulatory Visit: Payer: BC Managed Care – PPO | Admitting: Nurse Practitioner

## 2022-08-14 ENCOUNTER — Ambulatory Visit: Payer: BC Managed Care – PPO | Admitting: Nurse Practitioner

## 2022-08-14 ENCOUNTER — Encounter: Payer: Self-pay | Admitting: Nurse Practitioner

## 2022-08-14 VITALS — BP 135/88 | HR 76 | Ht 70.8 in | Wt 240.8 lb

## 2022-08-14 DIAGNOSIS — G8929 Other chronic pain: Secondary | ICD-10-CM | POA: Diagnosis not present

## 2022-08-14 DIAGNOSIS — M79672 Pain in left foot: Secondary | ICD-10-CM | POA: Diagnosis not present

## 2022-08-14 MED ORDER — PREDNISONE 10 MG (21) PO TBPK: ORAL_TABLET | ORAL | 0 refills | Status: DC

## 2022-08-14 NOTE — Progress Notes (Signed)
Established patient visit   Patient: Dennis Owens   DOB: Oct 03, 1985   37 y.o. Male  MRN: [id number] Visit Date: 08/14/2022   Chief Complaint  Patient presents with   Foot Pain   Subjective    Has tried using ice on the area. States that when he stood up, the pain was as severe as first thing in the morning.  Pain is most severe at the inner and posterior aspect of the left foot and heel.  He denies injury or trauma to the left foot.   Foot Pain This is a new problem. The current episode started 1 to 4 weeks ago. The problem occurs 2 to 4 times per day (hurts if he is on it too much. hurts first thing in the morning). The problem has been gradually improving. Pertinent negatives include no weakness. The symptoms are aggravated by standing and walking (standing and walking for long periodss. standing up first thing in the morning.). He has tried NSAIDs for the symptoms. The treatment provided no relief.      Medications: Outpatient Medications Prior to Visit  Medication Sig   [DISCONTINUED] XIFAXAN 550 MG TABS tablet Take 550 mg by mouth 3 (three) times daily.   No facility-administered medications prior to visit.    Review of Systems  Neurological:  Negative for weakness.       Objective     Today's Vitals   08/14/22 1457  BP: 135/88  Pulse: 76  SpO2: 98%  Weight: 240 lb 12.8 oz (109.2 kg)  Height: 5' 10.8" (1.798 m)   Body mass index is 33.77 kg/m.  BP Readings from Last 3 Encounters:  08/14/22 135/88  03/30/22 127/77  03/28/22 118/75    Wt Readings from Last 3 Encounters:  08/14/22 240 lb 12.8 oz (109.2 kg)  04/10/22 230 lb (104.3 kg)  03/30/22 231 lb 12.8 oz (105.1 kg)    Physical Exam Vitals and nursing note reviewed.  Constitutional:      Appearance: Normal appearance. He is well-developed.  HENT:     Head: Normocephalic and atraumatic.     Nose: Nose normal.     Mouth/Throat:     Mouth: Mucous membranes are moist.     Pharynx: Oropharynx is  clear.  Eyes:     Extraocular Movements: Extraocular movements intact.     Conjunctiva/sclera: Conjunctivae normal.     Pupils: Pupils are equal, round, and reactive to light.  Neck:     Vascular: No carotid bruit.  Cardiovascular:     Rate and Rhythm: Normal rate and regular rhythm.     Pulses: Normal pulses.     Heart sounds: Normal heart sounds.  Pulmonary:     Effort: Pulmonary effort is normal.     Breath sounds: Normal breath sounds.  Abdominal:     Palpations: Abdomen is soft.  Musculoskeletal:        General: Normal range of motion.     Cervical back: Normal range of motion and neck supple.       Feet:  Lymphadenopathy:     Cervical: No cervical adenopathy.  Skin:    General: Skin is warm and dry.     Capillary Refill: Capillary refill takes less than 2 seconds.  Neurological:     General: No focal deficit present.     Mental Status: He is alert and oriented to person, place, and time.  Psychiatric:        Mood and Affect: Mood normal.  Behavior: Behavior normal.        Thought Content: Thought content normal.        Judgment: Judgment normal.      Assessment & Plan    Inflammatory pain of left heel Assessment & Plan: Continue to alternate tylenol and ibuprofen as needed and as indicated to relieve pain. Recommend warm compress in 20 minute intervals. Start prednisone taper. Take as directed for 6 days. Will get x-ray of left foot for further evaluation. Refer to podiatry as indicated.   Orders: -     DG Foot 2 Views Left; Future  Chronic heel pain, left -     predniSONE; 6 day taper - take by mouth as directed for 6 days  Dispense: 21 tablet; Refill: 0 -     DG Foot 2 Views Left; Future     Return for prn worsening or persistent symptoms.         Ronnell Freshwater, NP  [facility] at [facility] [phone] (phone) [phone] (fax)  [place]

## 2022-08-14 NOTE — Assessment & Plan Note (Signed)
Continue to alternate tylenol and ibuprofen as needed and as indicated to relieve pain. Recommend warm compress in 20 minute intervals. Start prednisone taper. Take as directed for 6 days. Will get x-ray of left foot for further evaluation. Refer to podiatry as indicated.

## 2022-08-15 ENCOUNTER — Ambulatory Visit
Admission: RE | Admit: 2022-08-15 | Discharge: 2022-08-15 | Disposition: A | Discharge status: Home / Self Care | Payer: BC Managed Care – PPO | Source: Ambulatory Visit | Attending: Nurse Practitioner | Admitting: Nurse Practitioner

## 2022-08-15 DIAGNOSIS — G8929 Other chronic pain: Secondary | ICD-10-CM

## 2022-08-15 DIAGNOSIS — M79672 Pain in left foot: Secondary | ICD-10-CM

## 2022-08-22 ENCOUNTER — Telehealth: Payer: Self-pay

## 2022-08-22 ENCOUNTER — Other Ambulatory Visit: Payer: Self-pay | Admitting: Nurse Practitioner

## 2022-08-22 DIAGNOSIS — M7732 Calcaneal spur, left foot: Secondary | ICD-10-CM

## 2022-08-22 DIAGNOSIS — M79672 Pain in left foot: Secondary | ICD-10-CM

## 2022-08-22 NOTE — Telephone Encounter (Signed)
Please let the patient know that I placed a referral to [facility] in [place]. Thanks  -HB

## 2022-08-22 NOTE — Progress Notes (Signed)
Small calcaneal spur. Will refer to podiatry if agreeable

## 2022-08-22 NOTE — Telephone Encounter (Signed)
I apologize. The x-ray shows a small calcaneal spur. I can refer to podiatry if he would like.

## 2022-08-22 NOTE — Telephone Encounter (Signed)
Pt called in wanting the results of his xray. Please advise.

## 2022-08-29 ENCOUNTER — Telehealth: Payer: Self-pay | Admitting: Gastroenterology

## 2022-08-29 NOTE — Telephone Encounter (Signed)
Called patient back and he stated that he had a form from his insurance that needed to be filled out by the provider for his medication. Patient asked me for my email and I gave it to him. I told him that I would print it out and give it to Dr. Tobi Bastos to fill out and then I would fax it. Patient thanked me and had no further questions.

## 2022-08-29 NOTE — Telephone Encounter (Signed)
Patient calling stating that he has a form that he needs filled out. Requesting call back.

## 2022-08-30 NOTE — Telephone Encounter (Signed)
On my email yesterday, I received an email for the patient and it was a disability form for Dr. Tobi Bastos to fill out. However, when I gave it to Dr. Tobi Bastos, he stated that he does not fill out forms due to patient having IBS-D. He also stated that if the patient needed it to be filled out, that he could reach out to his primary care doctor and ask if they could fill it out. I sent an email back to the patient stating that above information.

## 2022-08-31 ENCOUNTER — Encounter: Payer: Self-pay | Admitting: Podiatry

## 2022-08-31 ENCOUNTER — Ambulatory Visit (INDEPENDENT_AMBULATORY_CARE_PROVIDER_SITE_OTHER): Payer: BC Managed Care – PPO | Admitting: Podiatry

## 2022-08-31 DIAGNOSIS — M7662 Achilles tendinitis, left leg: Secondary | ICD-10-CM

## 2022-08-31 MED ORDER — TRIAMCINOLONE ACETONIDE 10 MG/ML IJ SUSP
10.0000 mg | Freq: Once | INTRAMUSCULAR | Status: AC
  Administered 2022-08-31: 10 mg

## 2022-08-31 MED ORDER — DICLOFENAC SODIUM 75 MG PO TBEC: 75.0000 mg | DELAYED_RELEASE_TABLET | Freq: Two times a day (BID) | ORAL | 2 refills | Status: DC

## 2022-08-31 NOTE — Patient Instructions (Signed)

## 2022-08-31 NOTE — Progress Notes (Signed)
Subjective:   Patient ID: Dennis Owens, male   DOB: 37 y.o.   MRN: [id number]   HPI Patient states the left heel is very sore in the back of the heel and making it hard to do any form of activity and patient wants to be more active.  Patient does have to jump up and down off the trucks and has not been as careful as he should.  Patient does not smoke likes to be active   Review of Systems  All other systems reviewed and are negative.       Objective:  Physical Exam Vitals and nursing note reviewed.  Constitutional:      Appearance: He is well-developed.  Pulmonary:     Effort: Pulmonary effort is normal.  Musculoskeletal:        General: Normal range of motion.  Skin:    General: Skin is warm.  Neurological:     Mental Status: He is alert.     Neurovascular status intact muscle strength found to be adequate range of motion adequate with exquisite discomfort posterior medial aspect of the left heel is quite inflamed central lateral aspect of the tendon is doing good with good digital perfusion well-oriented x 3 no equinus no indications of tendon dysfunction     Assessment:  Achilles tendinitis left improving quite a bit at the current time     Plan:  H&P reviewed and recommended conservative treatment and I went ahead today and I discussed injection explaining risk and he wants to undergo this treatment.  I did do sterile prep I injected just on the medial side 3 mg dexamethasone Kenalog 5 mg Xylocaine to reduce the inflammation and I applied air fracture walker to completely immobilize that I like him to wear is much as possible for the next 3 weeks.  Patient will be seen back all questions answered today.  I did review his x-ray indicating small spur no indication of stress fracture.  Also placed on anti-inflammatory

## 2022-09-21 ENCOUNTER — Ambulatory Visit: Payer: BC Managed Care – PPO | Admitting: Podiatry

## 2022-09-25 ENCOUNTER — Ambulatory Visit (INDEPENDENT_AMBULATORY_CARE_PROVIDER_SITE_OTHER): Payer: BC Managed Care – PPO | Admitting: Podiatry

## 2022-09-25 ENCOUNTER — Encounter: Payer: Self-pay | Admitting: Podiatry

## 2022-09-25 DIAGNOSIS — M7662 Achilles tendinitis, left leg: Secondary | ICD-10-CM

## 2022-09-25 NOTE — Progress Notes (Signed)
Subjective:   Patient ID: Dennis Owens, male   DOB: 37 y.o.   MRN: [id number]   HPI Patient states feeling a lot better minimal discomfort noted back of the left heel   ROS      Objective:  Physical Exam  Neurovascular status intact inflammation pain reduced quite dramatically posterior aspect left heel.  Mild discomfort only upon deep palpation but much better     Assessment:  Significant improvement Achilles tendinitis left     Plan:  Reviewed condition continue weaning off boot should be done with that soon and discussed continued stretching exercises ice as needed return to normal activities but to be careful with activities that traction the heel too much.  Reappoint to recheck
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
## 2023-06-07 ENCOUNTER — Telehealth: Payer: Self-pay | Admitting: Gastroenterology

## 2023-06-07 NOTE — Telephone Encounter (Signed)
The patient called in and left a voicemail requesting for [place] to call him back. He wants to know why collection sent him to [place]. I called her back to let him know that we got him message, and I inform him that he will have to call billing. I gave him the number to billing.

## 2024-04-21 ENCOUNTER — Inpatient Hospital Stay (HOSPITAL_COMMUNITY)
Admission: AD | Admit: 2024-04-21 | Discharge: 2024-04-25 | DRG: 885 | Disposition: A | Discharge status: Home / Self Care | Source: Intra-hospital

## 2024-04-21 ENCOUNTER — Encounter (HOSPITAL_COMMUNITY): Payer: Self-pay | Admitting: Psychiatry

## 2024-04-21 ENCOUNTER — Emergency Department (HOSPITAL_COMMUNITY)
Admission: EM | Admit: 2024-04-21 | Discharge: 2024-04-21 | Disposition: A | Discharge status: Other Acute Inpatient Hospital | Attending: Emergency Medicine | Admitting: Emergency Medicine

## 2024-04-21 ENCOUNTER — Emergency Department (HOSPITAL_COMMUNITY)

## 2024-04-21 ENCOUNTER — Other Ambulatory Visit: Payer: Self-pay

## 2024-04-21 DIAGNOSIS — F332 Major depressive disorder, recurrent severe without psychotic features: Secondary | ICD-10-CM | POA: Diagnosis not present

## 2024-04-21 DIAGNOSIS — R45851 Suicidal ideations: Secondary | ICD-10-CM | POA: Insufficient documentation

## 2024-04-21 DIAGNOSIS — R4182 Altered mental status, unspecified: Secondary | ICD-10-CM | POA: Insufficient documentation

## 2024-04-21 DIAGNOSIS — F4310 Post-traumatic stress disorder, unspecified: Secondary | ICD-10-CM | POA: Diagnosis present

## 2024-04-21 DIAGNOSIS — F4381 Prolonged grief disorder: Secondary | ICD-10-CM | POA: Diagnosis present

## 2024-04-21 DIAGNOSIS — F1994 Other psychoactive substance use, unspecified with psychoactive substance-induced mood disorder: Secondary | ICD-10-CM | POA: Diagnosis not present

## 2024-04-21 LAB — CBC WITH DIFFERENTIAL/PLATELET
Abs Immature Granulocytes: 0.02 K/uL (ref 0.00–0.07)
Basophils Absolute: 0 K/uL (ref 0.0–0.1)
Basophils Relative: 0 %
Eosinophils Absolute: 0 K/uL (ref 0.0–0.5)
Eosinophils Relative: 0 %
HCT: 44.1 % (ref 39.0–52.0)
Hemoglobin: 15.6 g/dL (ref 13.0–17.0)
Immature Granulocytes: 0 %
Lymphocytes Relative: 16 %
Lymphs Abs: 1 K/uL (ref 0.7–4.0)
MCH: 32.4 pg (ref 26.0–34.0)
MCHC: 35.4 g/dL (ref 30.0–36.0)
MCV: 91.7 fL (ref 80.0–100.0)
Monocytes Absolute: 0.7 K/uL (ref 0.1–1.0)
Monocytes Relative: 11 %
Neutro Abs: 4.5 K/uL (ref 1.7–7.7)
Neutrophils Relative %: 73 %
Platelets: 187 K/uL (ref 150–400)
RBC: 4.81 MIL/uL (ref 4.22–5.81)
RDW: 12.6 % (ref 11.5–15.5)
WBC: 6.2 K/uL (ref 4.0–10.5)
nRBC: 0 % (ref 0.0–0.2)

## 2024-04-21 LAB — COMPREHENSIVE METABOLIC PANEL WITH GFR
ALT: 68 U/L — ABNORMAL HIGH (ref 0–44)
AST: 44 U/L — ABNORMAL HIGH (ref 15–41)
Albumin: 4.5 g/dL (ref 3.5–5.0)
Alkaline Phosphatase: 70 U/L (ref 38–126)
Anion gap: 15 (ref 5–15)
BUN: 8 mg/dL (ref 6–20)
CO2: 21 mmol/L — ABNORMAL LOW (ref 22–32)
Calcium: 9 mg/dL (ref 8.9–10.3)
Chloride: 107 mmol/L (ref 98–111)
Creatinine, Ser: 0.9 mg/dL (ref 0.61–1.24)
GFR, Estimated: >60 mL/min (ref >60)
Glucose, Bld: 109 mg/dL — ABNORMAL HIGH (ref 70–99)
Potassium: 3.9 mmol/L (ref 3.5–5.1)
Sodium: 143 mmol/L (ref 135–145)
Total Bilirubin: 0.4 mg/dL (ref 0.0–1.2)
Total Protein: 7.5 g/dL (ref 6.5–8.1)

## 2024-04-21 LAB — LIPASE, BLOOD: Lipase: 27 U/L (ref 11–51)

## 2024-04-21 LAB — URINE DRUG SCREEN
Amphetamines: NEGATIVE
Barbiturates: NEGATIVE
Benzodiazepines: NEGATIVE
Cocaine: NEGATIVE
Fentanyl: NEGATIVE
Methadone Scn, Ur: NEGATIVE
Opiates: NEGATIVE
Tetrahydrocannabinol: NEGATIVE

## 2024-04-21 LAB — URINALYSIS, ROUTINE W REFLEX MICROSCOPIC
Bilirubin Urine: NEGATIVE
Glucose, UA: NEGATIVE mg/dL
Hgb urine dipstick: NEGATIVE
Ketones, ur: NEGATIVE mg/dL
Leukocytes,Ua: NEGATIVE
Nitrite: NEGATIVE
Protein, ur: NEGATIVE mg/dL
Specific Gravity, Urine: 1.008 (ref 1.005–1.030)
pH: 6 (ref 5.0–8.0)

## 2024-04-21 LAB — SALICYLATE LEVEL: Salicylate Lvl: <7 mg/dL — ABNORMAL LOW (ref 7.0–30.0)

## 2024-04-21 LAB — ETHANOL: Alcohol, Ethyl (B): 54 mg/dL — ABNORMAL HIGH (ref <15)

## 2024-04-21 LAB — ACETAMINOPHEN LEVEL: Acetaminophen (Tylenol), Serum: <10 ug/mL — ABNORMAL LOW (ref 10–30)

## 2024-04-21 MED ORDER — ONDANSETRON 4 MG PO TBDP: 4.0000 mg | ORAL_TABLET | Freq: Four times a day (QID) | ORAL | Status: AC | PRN

## 2024-04-21 MED ORDER — LOPERAMIDE HCL 2 MG PO CAPS: 2.0000 mg | ORAL_CAPSULE | ORAL | Status: DC | PRN

## 2024-04-21 MED ORDER — DIPHENHYDRAMINE HCL 50 MG/ML IJ SOLN: 50.0000 mg | Freq: Three times a day (TID) | INTRAMUSCULAR | Status: DC | PRN

## 2024-04-21 MED ORDER — VITAMIN B-1 100 MG PO TABS
100.0000 mg | ORAL_TABLET | Freq: Every day | ORAL | Status: DC
  Administered 2024-04-25: 100 mg via ORAL
  Filled 2024-04-21 (×2): qty 1

## 2024-04-21 MED ORDER — HYDROXYZINE HCL 25 MG PO TABS: 25.0000 mg | ORAL_TABLET | Freq: Four times a day (QID) | ORAL | Status: DC | PRN

## 2024-04-21 MED ORDER — ACETAMINOPHEN 325 MG PO TABS: 650.0000 mg | ORAL_TABLET | Freq: Four times a day (QID) | ORAL | Status: DC | PRN

## 2024-04-21 MED ORDER — DIAZEPAM 5 MG/ML IJ SOLN
5.0000 mg | Freq: Once | INTRAMUSCULAR | Status: AC
  Administered 2024-04-21: 5 mg via INTRAVENOUS
  Filled 2024-04-21: qty 2

## 2024-04-21 MED ORDER — MAGNESIUM HYDROXIDE 400 MG/5ML PO SUSP: 30.0000 mL | Freq: Every day | ORAL | Status: DC | PRN

## 2024-04-21 MED ORDER — LACTATED RINGERS IV BOLUS
1000.0000 mL | Freq: Once | INTRAVENOUS | Status: AC
  Administered 2024-04-21: 1000 mL via INTRAVENOUS

## 2024-04-21 MED ORDER — ADULT MULTIVITAMIN W/MINERALS CH
1.0000 | ORAL_TABLET | Freq: Every day | ORAL | Status: DC
  Administered 2024-04-25: 1 via ORAL
  Filled 2024-04-21 (×3): qty 1

## 2024-04-21 MED ORDER — THIAMINE HCL 100 MG/ML IJ SOLN: 100.0000 mg | Freq: Once | INTRAMUSCULAR | Status: DC

## 2024-04-21 MED ORDER — LOPERAMIDE HCL 2 MG PO CAPS: 2.0000 mg | ORAL_CAPSULE | ORAL | Status: AC | PRN

## 2024-04-21 MED ORDER — HYDROXYZINE HCL 25 MG PO TABS: 25.0000 mg | ORAL_TABLET | Freq: Four times a day (QID) | ORAL | Status: AC | PRN

## 2024-04-21 MED ORDER — LORAZEPAM 1 MG PO TABS: 1.0000 mg | ORAL_TABLET | Freq: Four times a day (QID) | ORAL | Status: AC | PRN

## 2024-04-21 MED ORDER — ADULT MULTIVITAMIN W/MINERALS CH
1.0000 | ORAL_TABLET | Freq: Every day | ORAL | Status: DC
  Administered 2024-04-21: 1 via ORAL
  Filled 2024-04-21: qty 1

## 2024-04-21 MED ORDER — ALUM & MAG HYDROXIDE-SIMETH 200-200-20 MG/5ML PO SUSP: 30.0000 mL | ORAL | Status: DC | PRN

## 2024-04-21 MED ORDER — ONDANSETRON 4 MG PO TBDP: 4.0000 mg | ORAL_TABLET | Freq: Four times a day (QID) | ORAL | Status: DC | PRN

## 2024-04-21 MED ORDER — HALOPERIDOL LACTATE 5 MG/ML IJ SOLN: 5.0000 mg | Freq: Three times a day (TID) | INTRAMUSCULAR | Status: DC | PRN

## 2024-04-21 MED ORDER — THIAMINE MONONITRATE 100 MG PO TABS: 100.0000 mg | ORAL_TABLET | Freq: Every day | ORAL | Status: DC

## 2024-04-21 MED ORDER — LORAZEPAM 1 MG PO TABS: 1.0000 mg | ORAL_TABLET | Freq: Four times a day (QID) | ORAL | Status: DC | PRN

## 2024-04-21 MED ORDER — LORAZEPAM 2 MG/ML IJ SOLN: 2.0000 mg | Freq: Three times a day (TID) | INTRAMUSCULAR | Status: DC | PRN

## 2024-04-21 NOTE — ED Notes (Signed)
 PT wanded prior to entry into TCU.  Belongings placed in locker 27

## 2024-04-21 NOTE — ED Notes (Signed)
 Patient has 2 bags in the cabinet 9 thru 12

## 2024-04-21 NOTE — Group Note (Signed)
 Date:  04/21/2024 Time:  3:25 PM  Group Topic/Focus: [facility] Group Spirituality:   The focus of this group is to discuss how one's spirituality can aide in recovery.    Participation Level:  Active   Additional Comments:  Patient attend  Dennis Owens 04/21/2024, 3:25 PM
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
## 2024-04-21 NOTE — Plan of Care (Signed)
   Problem: Education: Goal: Knowledge of [place] [facility] Education information/materials will improve Outcome: Progressing Goal: Emotional status will improve Outcome: Progressing Goal: Mental status will improve Outcome: Progressing Goal: Verbalization of understanding the information provided will improve Outcome: Progressing

## 2024-04-21 NOTE — ED Notes (Signed)
 Patient off unit to [facility] per provider. Patient alert, cooperative, no s/s of distress at time of discharge. Discharge information and belongings given to [employer] for transport. Patient ambulatory off unit, escorted and transported by [employer].

## 2024-04-21 NOTE — ED Notes (Signed)
 Wife Lexton Hidalgo left her # [phone], would like to get updates

## 2024-04-21 NOTE — Progress Notes (Signed)
 Spiritual care group on grief and loss facilitated by Chaplain Rockie Sofia, [facility]  Group Goal: Support / Education around grief and loss  Members engage in facilitated group support and psycho-social education.  Group Description:  Following introductions and group rules, group members engaged in facilitated group dialogue and support around topic of loss, with particular support around experiences of loss in their lives. Group Identified types of loss (relationships / self / things) and identified patterns, circumstances, and changes that precipitate losses. Reflected on thoughts / feelings around loss, normalized grief responses, and recognized variety in grief experience. Group encouraged individual reflection on safe space and on the coping skills that they are already utilizing.  Group drew on Adlerian / Rogerian and narrative framework  Patient Progress: Dennis Owens attended group.  Though verbal participation was minimal, he demonstrated engagement in the group conversation and participated in the group activities.
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
## 2024-04-21 NOTE — Tx Team (Signed)
 Initial Treatment Plan 04/21/2024 11:35 AM [name] FMW:[id number]    PATIENT STRESSORS: Marital or family conflict   Traumatic event     PATIENT STRENGTHS: Average or above average intelligence  Capable of independent living  Communication skills  General fund of knowledge  Supportive family/friends    PATIENT IDENTIFIED PROBLEMS: I want to get help.                      DISCHARGE CRITERIA:  Improved stabilization in mood, thinking, and/or behavior  PRELIMINARY DISCHARGE PLAN: Outpatient therapy  PATIENT/FAMILY INVOLVEMENT: This treatment plan has been presented to and reviewed with the patient, [name].  The patient has been given the opportunity to ask questions and make suggestions.  Annalee Larch, RN 04/21/2024, 11:35 AM
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
## 2024-04-22 DIAGNOSIS — F4310 Post-traumatic stress disorder, unspecified: Secondary | ICD-10-CM | POA: Diagnosis present

## 2024-04-22 LAB — LIPID PANEL
Cholesterol: 137 mg/dL (ref 0–200)
HDL: 65 mg/dL (ref >40)
LDL Cholesterol: 52 mg/dL (ref 0–99)
Total CHOL/HDL Ratio: 2.1 ratio
Triglycerides: 102 mg/dL (ref <150)
VLDL: 20 mg/dL (ref 0–40)
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
## 2024-04-22 NOTE — Progress Notes (Signed)
   04/22/24 0800  Psych Admission Type (Psych Patients Only)  Admission Status Involuntary  Psychosocial Assessment  Patient Complaints None  Eye Contact Fair  Facial Expression Flat  Affect Sad  Speech Logical/coherent  Interaction Guarded;Minimal  Motor Activity Slow  Appearance/Hygiene In scrubs  Behavior Characteristics Cooperative  Mood Depressed  Thought Process  Coherency WDL  Content WDL  Delusions None reported or observed  Perception WDL  Hallucination None reported or observed  Judgment Impaired  Confusion None  Danger to Self  Current suicidal ideation? Denies  Agreement Not to Harm Self Yes  Description of Agreement verbal  Danger to Others  Danger to Others None reported or observed

## 2024-04-22 NOTE — Plan of Care (Signed)
   Problem: Education: Goal: Knowledge of [place] [facility] Education information/materials will improve Outcome: Progressing Goal: Emotional status will improve Outcome: Progressing Goal: Mental status will improve Outcome: Progressing Goal: Verbalization of understanding the information provided will improve Outcome: Progressing

## 2024-04-22 NOTE — Plan of Care (Signed)
   Problem: Education: Goal: Emotional status will improve Outcome: Progressing Goal: Mental status will improve Outcome: Progressing Goal: Verbalization of understanding the information provided will improve Outcome: Progressing   Problem: Activity: Goal: Interest or engagement in activities will improve Outcome: Progressing

## 2024-04-22 NOTE — [facility] Suicide Risk Assessment (Signed)
 Suicide Risk Assessment  Admission Assessment    [facility] Admission Suicide Risk Assessment   Nursing information obtained from:  Patient Demographic factors:  Male Current Mental Status:  NA Loss Factors:  Loss of significant relationship Historical Factors:  NA Risk Reduction Factors:  Responsible for children under 37 years of age, Sense of responsibility to family, Religious beliefs about death  Total Time spent with patient: 1 hour Principal Problem: Major depressive disorder, recurrent severe without psychotic features (HCC) Diagnosis:  Principal Problem:   Major depressive disorder, recurrent severe without psychotic features (HCC) Active Problems:   PTSD (post-traumatic stress disorder)  Subjective Data:  Dennis Owens is a 38 y.o. male  with a past psychiatric history of PTSD. Patient initially arrived to [facility] on 12/1 for AMS and was admitted to [facility] under IVC on 12/1 for acute safety concerns, crisis stabalization, acute suicidal or self-harming behaviors, and substance related issues. PMHx is unremarkable.  On assessment, patient is friendly and forthcoming about the stressors in his life contributing to his psychiatric hospitalization.  Patient says in 2007 he lost his roommate in [place], he lost his mother September 11 this year, and has been having marital issues with his wife for the past 2 years, most recently he is suspicious of infidelity given she was able to schedule an appointment with her OB/GYN very quickly for STD testing.  Patient gives disorganized recollection of events leading to this hospitalization, attributing this to a slip up and inappropriate coping strategies.  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 Patient says he knows she is  cheating on him because she is using a cream for STDs, and has also found photos of a man on his wife's phone, and has observed them speaking with each other on the phone.  Gently confronted patient about need to take a gun with him into the middle of the night while he is drunk, patient says he was in a bad area of town and needed it for protection.  Patient says he recently started individual therapy at the end of October at the [facility], and began doing couples therapy with his wife. Patient denies feeling depressed, says he is a really happy guy, denies problems with sleep, anhedonia, decreased energy, appetite concerns, suicidal ideation, though endorses guilt from the death of his roommate.  Patient denies feeling anxious, denies nightmares, denies social anxiety, says he tries to get out more when he is feeling down or wrestle with his kids.  Discussed with patient starting antidepressant and continuing individual therapy and PTSD specific therapy, though patient denies wanting to begin a medication.  Patient denies having difficulty coping with stressors in his life,  Attempted to call Rossi Burdo, spouse, [phone], will attempt again later.  Upheld IVC.  Continued Clinical Symptoms:  Alcohol Use Disorder Identification Test Final Score (AUDIT): 7 The Alcohol Use Disorders Identification Test, Guidelines for Use in Primary Care, Second Edition.  World Science Writer [facility]). Score between 0-7:  no or low risk or alcohol related problems. Score between 8-15:  moderate risk of alcohol related problems. Score between 16-19:  high risk of alcohol related problems. Score 20 or above:  warrants further diagnostic evaluation for alcohol dependence and treatment.   CLINICAL FACTORS:  Alcohol/Substance Abuse/Dependencies   Musculoskeletal: Strength & Muscle Tone: within normal limits Gait & Station: normal Patient leans: N/A  Psychiatric Specialty Exam:  Presentation  General  Appearance: Appropriate for Environment; Casual  Eye Contact:Good  Speech:Clear and Coherent  Speech Volume:Normal  Handedness:No data recorded  Mood and Affect  Mood:Euphoric  Affect:Congruent; Full Range   Thought Process  Thought Processes:Goal Directed; Linear  Descriptions of Associations:Intact  Orientation:Full (Time, Place and Person)  Thought Content:WDL  History of Schizophrenia/Schizoaffective disorder:No  Duration of Psychotic Symptoms:No data recorded Hallucinations:Hallucinations: None  Ideas of Reference:None  Suicidal Thoughts:Suicidal Thoughts: No  Homicidal Thoughts:Homicidal Thoughts: No   Sensorium  Memory:Immediate Good; Recent Fair  Judgment:Poor  Insight:Poor   Executive Functions  Concentration:Good  Attention Span:Good  Recall:Good  Fund of Knowledge:Good  Language:Good   Psychomotor Activity  Psychomotor Activity:Psychomotor Activity: Normal   Assets  Assets:Communication Skills; Resilience; Physical Health   Sleep  Sleep:Sleep: Good    Physical Exam: Physical Exam Constitutional:      General: He is not in acute distress.    Appearance: He is not ill-appearing, toxic-appearing or diaphoretic.  Pulmonary:     Effort: Pulmonary effort is normal.  Neurological:     Mental Status: He is alert.    Review of Systems  Neurological:  Negative for dizziness and headaches.  Psychiatric/Behavioral:  Negative for hallucinations and suicidal ideas. The patient is not nervous/anxious.    Blood pressure (!) 132/90, pulse 78, temperature 98.2 F (36.8 C), temperature source Oral, resp. rate 18, height 5' 10 (1.778 m), weight 101.6 kg, SpO2 100%. Body mass index is 32.14 kg/m.   COGNITIVE FEATURES THAT CONTRIBUTE TO RISK:  None    SUICIDE RISK:   Moderate:  Frequent suicidal ideation with limited intensity, and duration, some specificity in terms of plans, no associated intent, good self-control, limited  dysphoria/symptomatology, some risk factors present, and identifiable protective factors, including available and accessible social support.  Patient at chronically moderate to high risk given history of PTSD, male sex, Caucasian, veteran and ongoing psychosocial stressors  PLAN OF CARE:   I certify that inpatient services furnished can reasonably be expected to improve the patient's condition.   Alfornia Light, DO 04/22/2024, 4:00 PM

## 2024-04-22 NOTE — [facility] Group Notes (Signed)
 [facility] Group Notes:  (Nursing/MHT/Case Management/Adjunct)  Date:  04/22/2024  Time:  1:14 AM  Type of Therapy:  Wrap up group  Participation Level:  Active  Participation Quality:  Appropriate  Affect:  Appropriate  Cognitive:  Appropriate  Insight:  Appropriate  Engagement in Group:  Engaged  Modes of Intervention:  Education  Summary of Progress/Problems: Goal to learn program, Rated his day 8/10.   Dennis Owens 04/22/2024, 1:14 AM

## 2024-04-22 NOTE — Group Note (Signed)
 Date:  04/22/2024 Time:  10:35 AM  Group Topic/Focus: Coping skills and orientation goals group Goals Group:   The focus of this group is to help patients establish daily goals to achieve during treatment and discuss how the patient can incorporate goal setting into their daily lives to aide in recovery. Orientation:   The focus of this group is to educate the patient on the purpose and policies of crisis stabilization and provide a format to answer questions about their admission.  The group details unit policies and expectations of patients while admitted.    Participation Level:  Active  Participation Quality:  Appropriate  Affect:  Appropriate  Cognitive:  Alert and Appropriate  Insight: Improving  Engagement in Group:  Improving  Modes of Intervention:  Discussion and Orientation  Additional Comments:    Dennis Owens 04/22/2024, 10:35 AM

## 2024-04-22 NOTE — Group Note (Signed)
 Date:  04/22/2024 Time:  4:32 PM  Group Topic/Focus:[facility] Coping With Mental Health Crisis:   The purpose of this group is to help patients identify strategies for coping with mental health crisis.  Group discusses possible causes of crisis and ways to manage them effectively.    Participation Level:  Active   [name] 04/22/2024, 4:32 PM

## 2024-04-22 NOTE — [facility] Group Notes (Signed)
 [facility] Group Notes:  (Nursing/MHT/Case Management/Adjunct)  Date:  04/22/2024  Time:  9:37 PM  Type of Therapy:  Psychoeducational Skills  Participation Level:  Minimal  Participation Quality:  Attentive  Affect:  Flat  Cognitive:  Lacking  Insight:  Limited  Engagement in Group:  Developing/Improving  Modes of Intervention:  Education  Summary of Progress/Problems: Patient rated his day as a 9 out of a possible 10 since he felt good today. He accomplished his goal for today was to talk to more people which he accomplished.   Ranesha Val S 04/22/2024, 9:37 PM

## 2024-04-22 NOTE — Group Note (Signed)
 Date:  04/22/2024 Time:  7:41 PM  Group Topic/Focus:  Goals Group:   The focus of this group is to help patients establish daily goals to achieve during treatment and discuss how the patient can incorporate goal setting into their daily lives to aide in recovery.  Participation Level:  Active  Participation Quality:  Appropriate  Affect:  Appropriate  Cognitive:  Appropriate  Insight: Appropriate  Engagement in Group:  Engaged  Modes of Intervention:  Discussion and Education  Additional Comments:    Juliene CHRISTELLA Huddle 04/22/2024, 7:41 PM

## 2024-04-22 NOTE — Group Note (Signed)
 LCSW Group Therapy Note   Group Date: 04/22/2024 Start Time: 1100 End Time: 1200   Participation:  patient was present.  Patient listened and was respectful but didn't participate in the discussion.  Type of Therapy:  Group Therapy   Topic:  Healing From Within: Understanding Our Past, Building Our Future    Objective:  To help participants understand the impact of early experiences on mental and physical health, with a focus on Adverse Childhood Experiences (ACEs), and to explore ways to build resilience and healing.  Group Goals: Understand ACEs and Their Impact: Learn how childhood experiences shape mental and physical health. Build Resilience: Develop strategies for overcoming challenges and creating positive change. Promote Healing: Recognize the value of support and the possibility of healing through therapy and self-care.  Summary: In today's session, we discussed how early experiences, especially ACEs, impact mental and physical health. We explored the effects of stress, abuse, and neglect on brain development and well-being. The group focused on resilience, understanding that healing and positive change are possible with support and self-awareness.  Therapeutic Modalities Used: Psychoeducation: Sharing information about ACEs and their effects. Cognitive Behavioral Therapy (CBT): Helping reframe negative thought patterns. Trauma-Informed Therapy: Creating a safe, supportive space for healing.   Palyn Scrima O Athleen Feltner, LCSWA 04/22/2024  12:20 PM

## 2024-04-22 NOTE — Group Note (Signed)
 Date:  04/22/2024 Time:  11:25 AM  Group Topic/Focus: Pet therapy  Overcoming Stress:   The focus of this group is to define stress and help patients assess their triggers.    Participation Level:  Active    Additional Comments:  Patient attend  Dennis Owens 04/22/2024, 11:25 AM

## 2024-04-22 NOTE — [facility] Counselor (Signed)
 Adult Comprehensive Assessment  Patient ID: Dennis Owens, male   DOB: 1985/10/04, 38 y.o.   MRN: [id number]  Information Source: Information source: Patient  Current Stressors:  Patient states their primary concerns and needs for treatment are:: The cops found me drunk, they found a gun with me. My wife asked me if I was talking to my daughter about our marriage. I was just being honest with my child about the dangers of addiction to pornography and she told me to get the f out. Patient denies having suicidal ideation at the time of the event. Patient denies SI, HI, and AVH. Patient states their goals for this hospitilization and ongoing recovery are:: Maybe just learn some more coping mechanisms to deal with stress Educational / Learning stressors: None reported Employment / Job issues: None reported Family Relationships: None reported Surveyor, Quantity / Lack of resources (include bankruptcy): None reported Housing / Lack of housing: None reported Physical health (include injuries & life threatening diseases): None reported Social relationships: None reported Substance abuse: None reported Bereavement / Loss: Mother passed in September 2025, loss of roommate from 2007, patient considered him like a brother  Living/Environment/Situation:  Living Arrangements: Children, Spouse/significant other Living conditions (as described by patient or guardian): It's fine, the therapist did separate us  for a little Who else lives in the home?: Wife and three kids How long has patient lived in current situation?: 2019 What is atmosphere in current home: Chaotic, Comfortable  Family History:  Marital status: Married Number of Years Married: 17 What types of issues is patient dealing with in the relationship?: I'm suspecting infidelity on her part. I'm pretty sure I have the evidence that she has. She met the guy in 2009 on a past deployment, she's been honest about it. Patient states that his wife  was honest about the extramarital affair during 2009 but now has heavy suspicions of infidelity again due to her recently obtaining a STI/STD screening and also witnessing her facetiming another man secretly in the car. Additional relationship information: Patient is still sleeping in other bedroom, suspected infidelity has been going on since end of last year. Are you sexually active?: Yes What is your sexual orientation?: Heterosexual Has your sexual activity been affected by drugs, alcohol, medication, or emotional stress?: Emotional stress Does patient have children?: Yes How many children?: 3 How is patient's relationship with their children?: 14, 10, 4. It's great, laugh with them, wrestle with the boys, play basketball with the girl.  Childhood History:  By whom was/is the patient raised?: Father, Mother Additional childhood history information: Mostly my dad. Split time on vacation from school with mom. Description of patient's relationship with caregiver when they were a child: It was good with dad, he was always there. Mom moved here so it was always back and forth. Patient's description of current relationship with people who raised him/her: It's great, like it always is. How were you disciplined when you got in trouble as a child/adolescent?: He just usually spoke to me about it. Does patient have siblings?: Yes Number of Siblings: 3 Description of patient's current relationship with siblings: Me and my sister have gotten closer since my mom passed. My older brother and I really get along. Younger brother is just quiet and to himself. Did patient suffer any verbal/emotional/physical/sexual abuse as a child?: No Has patient ever been sexually abused/assaulted/raped as an adolescent or adult?: No Was the patient ever a victim of a crime or a disaster?: No Witnessed domestic violence?:  No  Education:  Highest grade of school patient has completed: [employer] Currently a  student?: No Learning disability?: No  Employment/Work Situation:   Employment Situation: Employed Where is Patient Currently Employed?: [employer] How Long has Patient Been Employed?: 20 years Are You Satisfied With Your Job?: Yes Do You Work More Than One Job?: No Work Stressors: None reported Patient's Job has Been Impacted by Current Illness: No What is the Longest Time Patient has Held a Job?: Current job Where was the Patient Employed at that Time?: Current job Has Patient ever Been in [employer]?: Yes (Describe in comment) ([employer] 8 years (2006-2010, 2011-2014)) Did You Receive Any Psychiatric Treatment/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 The alcohol I drink very rarely. If attempted suicide, did drugs/alcohol play a role in this?: No Alcohol/Substance Abuse Treatment Hx: Denies past history Has alcohol/substance abuse ever caused legal problems?: No  Social Support System:   Patient's Community Support System: Good Describe Community Support System: It's pretty good. I usually go to my friends if I've got something. Type of faith/religion: Baptist How does patient's faith help to cope with current illness?: Yes, I've got a really good Sunday school teacher who I'm friends with. A buddy of mine from work set me up with my therapist who is a religious marriage veterinary surgeon.  Leisure/Recreation:   Do You Have Hobbies?: Yes Leisure and Hobbies: Sports, woodworking.  Strengths/Needs:   What is the patient's perception of their strengths?: I'm loyal, trustworthy Patient states they can use these personal strengths during their treatment to  contribute to their recovery: Just be loyal to myself and try not to let this bother me. Try to get back who the fun side is and stop stressing. Patient states these barriers may affect/interfere with their treatment: None reported Patient states these barriers may affect their return to the community: None reported  Discharge Plan:   Currently receiving community mental health services: Yes (From Whom) En[facility] Pierini at [facility]) Patient states concerns and preferences for aftercare planning are: Patient wants to continue with therapist, not sure about psychiatry Patient states they will know when they are safe and ready for discharge when: I think I'm safe and ready now. Just waiting to be told I can go Does patient have access to transportation?: No Does patient have financial barriers related to discharge medications?: No Will patient be returning to same living situation after discharge?: Yes  Summary/Recommendations:   Summary and Recommendations (to be completed by the evaluator): Cheryl BROCKS. Andalon II is a 38 yo male involuntarily admitted to [facility] secondary to [facility] due to passive SI. Patient's wife called police upon patient leaving the home intoxicated with a firearm after an argument. Patient denies having any SI at the time of hospitalization. Patient denies any current SI, HI, and AVH. Patient identified his strained marriage and the recent loss of his mother as his main stressors. Patient denies any depressive or anxiety sx. 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 Patient believes his wife has been having an affair again since end of 2024 with the  same person. Patient states they receive family and marriage counseling from Jack Hileman through the [facility] and it's been beneficial. Patient served a total of 8 years in the [place]  [employer] and was evaluated for PTSD during and after  his time served. Patient is in the process of filing for benefits due to PTSD. Patient has steady employment and housing. Patient denies any alcohol/substance abuse hx and states he drinks rarely. UDS negative for all substances. Patient reports a strong support system consisting of both family, community, and friends. Patient's goal for treatment is to identify coping skills for stress.  While here, [place] can benefit from crisis stabilization, medication management, therapeutic milieu, and referrals for services.   Louetta Lame. 04/22/2024

## 2024-04-22 NOTE — Group Note (Signed)
 Recreation Therapy Group Note   Group Topic:Animal Assisted Therapy   Group Date: 04/22/2024 Start Time: 9048 End Time: 1030 Facilitators: Royal Vandevoort-McCall, LRT,CTRS Location: [address] Dayroom   Animal-Assisted Activity (AAA) Program Checklist/Progress Notes Patient Eligibility Criteria Checklist & Daily Group note for Rec Tx Intervention  AAA/T Program Assumption of Risk Form signed by Patient/ or Parent Legal Guardian Yes  Patient is free of allergies or severe asthma Yes  Patient reports no fear of animals Yes  Patient reports no history of cruelty to animals Yes  Patient understands his/her participation is voluntary Yes  Patient washes hands before animal contact Yes  Patient washes hands after animal contact Yes  Behavioral Response:    Education: Hand Washing, Appropriate Animal Interaction   Education Outcome: Acknowledges education.    Affect/Mood: Appropriate   Participation Level: Engaged   Participation Quality: Independent   Behavior: Appropriate   Speech/Thought Process: Focused   Insight: Good   Judgement: Good   Modes of Intervention: Teaching Laboratory Technician   Patient Response to Interventions:  Engaged   Education Outcome:  In group clarification offered    Clinical Observations/Individualized Feedback: Patient attended session and interacted appropriately with therapy dog and peers. Patient asked appropriate questions about therapy dog and his training. Patient shared stories about their pets at home with group.      Plan: Continue to engage patient in RT group sessions 2-3x/week.   Jagar Lua-McCall, LRT,CTRS 04/22/2024 1:09 PM

## 2024-04-22 NOTE — Progress Notes (Signed)
(  Sleep Hours) - 6.5 (Any PRNs that were needed, meds refused, or side effects to meds)- No PRN meds given, no meds refused.  (Any disturbances and when (visitation, over night)- None  (Concerns raised by the patient)- None  (SI/HI/AVH)- Denies SI/HI/AVH

## 2024-04-23 ENCOUNTER — Encounter (HOSPITAL_COMMUNITY): Payer: Self-pay

## 2024-04-23 LAB — HEMOGLOBIN A1C
Hgb A1c MFr Bld: 5.2 % (ref 4.8–5.6)
Mean Plasma Glucose: 103 mg/dL

## 2024-04-23 MED ORDER — SERTRALINE HCL 25 MG PO TABS
25.0000 mg | ORAL_TABLET | Freq: Every day | ORAL | Status: DC
  Administered 2024-04-23: 25 mg via ORAL
  Filled 2024-04-23: qty 1

## 2024-04-23 NOTE — Group Note (Signed)
 Recreation Therapy Group Note   Group Topic:Team Building  Group Date: 04/23/2024 Start Time: 0930 End Time: 1002 Facilitators: Kishana Battey-McCall, LRT,CTRS Location: [address] Dayroom   Group Topic: Communication, Team Building, Problem Solving  Goal Area(s) Addresses:  Patient will effectively work with peer towards shared goal.  Patient will identify skills used to make activity successful.  Patient will identify how skills used during activity can be used to reach post d/c goals.   Behavioral Response: Engaged  Intervention: STEM Activity  Activity: Straw Bridge. In teams of 3-5, patients were given 15 plastic drinking straws and an equal length of masking tape. Using the materials provided, patients were instructed to build a free standing bridge-like structure to suspend an everyday item (ex: puzzle box) off of the floor or table surface. All materials were required to be used by the team in their design. LRT facilitated post-activity discussion reviewing team process. Patients were encouraged to reflect how the skills used in this activity can be generalized to daily life post discharge.   Education: Pharmacist, Community, Scientist, Physiological, Discharge Planning   Education Outcome: Acknowledges education/In group clarification offered/Needs additional education.    Affect/Mood: Appropriate   Participation Level: Engaged   Participation Quality: Independent   Behavior: Appropriate   Speech/Thought Process: Focused   Insight: Good   Judgement: Good   Modes of Intervention: STEM Activity   Patient Response to Interventions:  Engaged   Education Outcome:  In group clarification offered    Clinical Observations/Individualized Feedback: Pt was bright and engaged during group. Pt shared leadership role with peer. Pt assisted with coming up with the design and putting it together. Pt was attentive and focused during group.     Plan: Continue to engage patient in RT group  sessions 2-3x/week.   Salvator Seppala-McCall, LRT,CTRS 04/23/2024 12:47 PM

## 2024-04-23 NOTE — Group Note (Signed)
 Date:  04/23/2024 Time:  11:24 AM  Group Topic/Focus:  Recreational Therapy    Participation Level:  Active    Gearline Dennis Owens 04/23/2024, 11:24 AM

## 2024-04-23 NOTE — Group Note (Signed)
 Date:  04/23/2024 Time:  1:01 PM  Group Topic/Focus:  Pharmacy group     Participation Level:  Active   Dennis Owens [name] 04/23/2024, 1:01 PM

## 2024-04-23 NOTE — Group Note (Signed)
 Date:  04/23/2024 Time:  4:48 PM  Group Topic/Focus:  Nursing Group     Participation Level:  Active   Dennis Owens 04/23/2024, 4:48 PM

## 2024-04-23 NOTE — Progress Notes (Incomplete)
 [facility] Inpatient Psychiatry Progress Note  Date: 04/23/2024 Patient: Dennis Owens MRN: [id number]  ASSESSMENT: This patient chronically suffers from posttraumatic stress disorder given his symptoms of reexperiencing, complicated nightmares and withdrawal after witnessing a traumatic event. 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    12/4: Patient agreeable with starting antidepressant today, will begin Zoloft 25 mg daily.  Patient continues to demonstrate minimal insight to his condition and factors that contribute to his mood, continuing encouragement of patient participation in the milieu.  PLAN: Psychiatric Diagnoses and Treatment # Substance-induced mood disorder # PTSD - Continue CIWA protocol with as needed Ativan - Start Zoloft 25 mg daily - Continue thiamine 100 mg daily   PRN's - Trazodone 50 mg at bedtime as needed for insomnia - Atarax 25 mg TID as needed for anxiety - Agitation Protocol: Haldol + Benadryl + Ativan   2. Active Medical Issues #Nicotine withdrawal - Patient does not need nicotine replacement   Other as needed medications  Tylenol 650 mg every 6 hours as needed for pain Mylanta 30 mL every 4 hours as needed for indigestion Milk of magnesia 30 mL daily as needed for constipation  Risk Assessment: Patient continues to require inpatient hospitalization for safety and stabilization of suicidal thoughts.  Discharge Planning: Barriers to Discharge: Medication management Estimated Length of Stay: 2-3 days Predicted Discharge Location: home  INTERVAL HISTORY: Chart reviewed. No significant events overnight, patient continues to score zeros on CIWA.  On assessment today, the patient reports   Patient denies SI/HI/AVH.  Forwarded to [employer].  PRN's in the last  24 hours include: none  Physical Examination:  Vitals and nursing note reviewed MSK: Normal gait and station  MENTAL STATUS EXAM:  Appearance: Casual, white male sitting up in bed   Behavior: Relaxed, maintains good eye contact   Attitude: Cooperative  Speech: Normal tone, prosody, inflection and volume   Mood: Really good  Affect: Congruent, full range  Thought Process: Linear, goal-directed  Thought Content: WNL  SI/HI: Denies  Perceptions: Denies AVH, does not appear to responding to internal stimuli  Judgement: Fair  Insight: Poor  Fund of Knowledge: WNL   Lab Results:  Admission on 04/21/2024  Component Date Value Ref Range Status   Hgb A1c MFr Bld 04/22/2024 5.2  4.8 - 5.6 % Final   Mean Plasma Glucose 04/22/2024 103  mg/dL Final   Cholesterol 87/97/7974 137  0 - 200 mg/dL Final   Triglycerides 87/97/7974 102  <150 mg/dL Final   HDL 87/97/7974 65  >40 mg/dL Final   Total CHOL/HDL Ratio 04/22/2024 2.1  RATIO Final   VLDL 04/22/2024 20  0 - 40 mg/dL Final   LDL Cholesterol 04/22/2024 52  0 - 99 mg/dL Final     Vitals: Blood pressure 123/77, pulse 72, temperature 97.7 F (36.5 C), temperature source Oral, resp. rate 18, height 5' 10 (1.778 m), weight 101.6 kg, SpO2 99%.   Alfornia Light, DO PGY-1, Psychiatry Residency  04/23/2024, 7:04 PM

## 2024-04-23 NOTE — Plan of Care (Signed)
   Problem: Education: Goal: Emotional status will improve Outcome: Progressing Goal: Mental status will improve Outcome: Progressing Goal: Verbalization of understanding the information provided will improve Outcome: Progressing   Problem: Activity: Goal: Interest or engagement in activities will improve Outcome: Progressing Goal: Sleeping patterns will improve Outcome: Progressing

## 2024-04-23 NOTE — Progress Notes (Signed)
(  Sleep Hours) -8 (Any PRNs that were needed, meds refused, or side effects to meds)- none (Any disturbances and when (visitation, over night)-none (Concerns raised by the patient)- none (SI/HI/AVH)-denied

## 2024-04-23 NOTE — BH IP Treatment Plan (Signed)
 Interdisciplinary Treatment and Diagnostic Plan Update  04/23/2024 Time of Session: 10:35 AM Dennis Owens MRN: [id number]  Principal Diagnosis: Major depressive disorder, recurrent severe without psychotic features (HCC)  Secondary Diagnoses: Principal Problem:   Major depressive disorder, recurrent severe without psychotic features (HCC) Active Problems:   PTSD (post-traumatic stress disorder)   Current Medications:  Current Facility-Administered Medications  Medication Dose Route Frequency Provider Last Rate Last Admin   acetaminophen (TYLENOL) tablet 650 mg  650 mg Oral Q6H PRN Lord, Jamison Y, NP       alum & mag hydroxide-simeth (MAALOX/MYLANTA) 200-200-20 MG/5ML suspension 30 mL  30 mL Oral Q4H PRN Jacquetta Sharlot GRADE, NP       haloperidol lactate (HALDOL) injection 5 mg  5 mg Intramuscular TID PRN Lord, Jamison Y, NP       And   diphenhydrAMINE (BENADRYL) injection 50 mg  50 mg Intramuscular TID PRN Lord, Jamison Y, NP       And   LORazepam (ATIVAN) injection 2 mg  2 mg Intramuscular TID PRN Lord, Jamison Y, NP       hydrOXYzine (ATARAX) tablet 25 mg  25 mg Oral Q6H PRN Lord, Jamison Y, NP       loperamide (IMODIUM) capsule 2-4 mg  2-4 mg Oral PRN Jacquetta Sharlot GRADE, NP       LORazepam (ATIVAN) tablet 1 mg  1 mg Oral Q6H PRN Jacquetta Sharlot GRADE, NP       magnesium hydroxide (MILK OF MAGNESIA) suspension 30 mL  30 mL Oral Daily PRN Lord, Jamison Y, NP       multivitamin with minerals tablet 1 tablet  1 tablet Oral Daily Jacquetta, Jamison Y, NP       ondansetron (ZOFRAN-ODT) disintegrating tablet 4 mg  4 mg Oral Q6H PRN Lord, Jamison Y, NP       sertraline (ZOLOFT) tablet 25 mg  25 mg Oral Daily Faunce, Alina, DO   25 mg at 04/23/24 1207   thiamine (Vitamin B-1) tablet 100 mg  100 mg Oral Daily Jacquetta Sharlot GRADE, NP       PTA Medications: No medications prior to admission.    Patient Stressors: Marital or family conflict   Traumatic event    Patient Strengths: Average or above average  intelligence  Capable of independent living  Communication skills  General fund of knowledge  Supportive family/friends   Treatment Modalities: Medication Management, Group therapy, Case management,  1 to 1 session with clinician, Psychoeducation, Recreational therapy.   Physician Treatment Plan for Primary Diagnosis: Major depressive disorder, recurrent severe without psychotic features (HCC) Long Term Goal(s):     Short Term Goals: Ability to identify and develop effective coping behaviors will improve  Medication Management: Evaluate patient's response, side effects, and tolerance of medication regimen.  Therapeutic Interventions: 1 to 1 sessions, Unit Group sessions and Medication administration.  Evaluation of Outcomes: Not Progressing  Physician Treatment Plan for Secondary Diagnosis: Principal Problem:   Major depressive disorder, recurrent severe without psychotic features (HCC) Active Problems:   PTSD (post-traumatic stress disorder)  Long Term Goal(s):     Short Term Goals: Ability to identify and develop effective coping behaviors will improve     Medication Management: Evaluate patient's response, side effects, and tolerance of medication regimen.  Therapeutic Interventions: 1 to 1 sessions, Unit Group sessions and Medication administration.  Evaluation of Outcomes: Not Progressing   RN Treatment Plan for Primary Diagnosis: Major depressive disorder, recurrent severe without psychotic features (  HCC) Long Term Goal(s): Knowledge of disease and therapeutic regimen to maintain health will improve  Short Term Goals: Ability to remain free from injury will improve, Ability to verbalize frustration and anger appropriately will improve, Ability to demonstrate self-control, Ability to participate in decision making will improve, Ability to verbalize feelings will improve, Ability to disclose and discuss suicidal ideas, Ability to identify and develop effective coping  behaviors will improve, and Compliance with prescribed medications will improve  Medication Management: RN will administer medications as ordered by provider, will assess and evaluate patient's response and provide education to patient for prescribed medication. RN will report any adverse and/or side effects to prescribing provider.  Therapeutic Interventions: 1 on 1 counseling sessions, Psychoeducation, Medication administration, Evaluate responses to treatment, Monitor vital signs and CBGs as ordered, Perform/monitor CIWA, COWS, AIMS and Fall Risk screenings as ordered, Perform wound care treatments as ordered.  Evaluation of Outcomes: Not Progressing   LCSW Treatment Plan for Primary Diagnosis: Major depressive disorder, recurrent severe without psychotic features (HCC) Long Term Goal(s): Safe transition to appropriate next level of care at discharge, Engage patient in therapeutic group addressing interpersonal concerns.  Short Term Goals: Engage patient in aftercare planning with referrals and resources, Increase social support, Increase ability to appropriately verbalize feelings, Increase emotional regulation, Facilitate acceptance of mental health diagnosis and concerns, Facilitate patient progression through stages of change regarding substance use diagnoses and concerns, Identify triggers associated with mental health/substance abuse issues, and Increase skills for wellness and recovery  Therapeutic Interventions: Assess for all discharge needs, 1 to 1 time with Social worker, Explore available resources and support systems, Assess for adequacy in community support network, Educate family and significant other(s) on suicide prevention, Complete Psychosocial Assessment, Interpersonal group therapy.  Evaluation of Outcomes: Not Progressing   Progress in Treatment: Attending groups: Yes. Participating in groups: Yes. Taking medication as prescribed: Yes. Toleration medication:  Yes. Family/Significant other contact made: No, will contact:  consents are pending Patient understands diagnosis: Yes. Discussing patient identified problems/goals with staff: Yes. Medical problems stabilized or resolved: Yes. Denies suicidal/homicidal ideation: Yes. Issues/concerns per patient self-inventory: No.  New problem(s) identified:  No  New Short Term/Long Term Goal(s):    medication stabilization, elimination of SI thoughts, development of comprehensive mental wellness plan.    Patient Goals:  If I have PTSD from the [employer], I want to get better.  Discharge Plan or Barriers:   Reason for Continuation of Hospitalization: Depression Medication stabilization Suicidal ideation  Estimated Length of Stay:  5 - 7 days  Last 3 [place] Suicide Severity Risk Score: Flowsheet Row [facility] Most recent reading at 04/21/2024 11:00 AM ED from 04/21/2024 in [facility] at [facility] Most recent reading at 04/21/2024  6:50 AM Admission (Discharged) from 03/28/2022 in [facility] Most recent reading at 03/28/2022  9:58 AM  C-SSRS RISK CATEGORY High Risk High Risk No Risk    Last PHQ 2/9 Scores:    08/14/2022    2:59 PM 10/10/2021    8:51 AM 07/18/2021    1:15 PM  Depression screen PHQ 2/9  Decreased Interest 0 0 0  Down, Depressed, Hopeless 0 0 0  PHQ - 2 Score 0 0 0  Altered sleeping 0 0 1  Tired, decreased energy 0 0 0  Change in appetite 0 0 0  Feeling bad or failure about yourself  0 0 0  Trouble concentrating  0 0 0  Moving slowly or fidgety/restless 0 0 0  Suicidal thoughts 0 0 0  PHQ-9 Score 0  0  1      Data saved with a previous flowsheet row definition    Scribe for Treatment Team: Ayonna Speranza O Cle[place] Savini, LCSWA 04/23/2024 3:47 PM

## 2024-04-23 NOTE — Progress Notes (Signed)
 Spirituality group facilitated by Elia Rockie Sofia, BCC.  Group Description: Group focused on topic of hope. Patients participated in facilitated discussion around topic, connecting with one another around experiences and definitions for hope. Group members engaged with visual explorer photos, reflecting on what hope looks like for them today. Group engaged in discussion around how their definitions of hope are present today in hospital.  Modalities: Psycho-social ed, Adlerian, Narrative, MI  Patient Progress: Dennis Owens attended part of group and he demonstrated engagement in the group conversation.

## 2024-04-23 NOTE — Group Note (Signed)
 Date:  04/23/2024 Time:  5:20 PM  Group Topic/Focus:  Developing a Wellness Toolbox:   The focus of this group is to help patients develop a wellness toolbox with skills and strategies to promote recovery upon discharge. Self Care:   The focus of this group is to help patients understand the importance of self-care in order to improve or restore emotional, physical, spiritual, interpersonal, and financial health. Sleep Hygiene    Participation Level:  Active  Participation Quality:  Attentive  Affect:  Appropriate  Cognitive:  Alert  Insight: Appropriate  Engagement in Group:  Engaged  Modes of Intervention:  Activity    Dennis Owens [name] 04/23/2024, 5:20 PM

## 2024-04-23 NOTE — Group Note (Signed)
 Date:  04/23/2024 Time:  9:49 AM  Group Topic/Focus:  Goals Group:   The focus of this group is to help patients establish daily goals to achieve during treatment and discuss how the patient can incorporate goal setting into their daily lives to aide in recovery. Orientation:   The focus of this group is to educate the patient on the purpose and policies of crisis stabilization and provide a format to answer questions about their admission.  The group details unit policies and expectations of patients while admitted.    Participation Level:  Active  Participation Quality:  Appropriate  Affect:  Appropriate  Cognitive:  Appropriate  Insight: Appropriate  Engagement in Group:  Engaged  Modes of Intervention:  Discussion and Orientation  Additional Comments:  Goal is to build relationships  Dennis Owens 04/23/2024, 9:49 AM

## 2024-04-23 NOTE — Progress Notes (Signed)
   04/23/24 0800  Psych Admission Type (Psych Patients Only)  Admission Status Involuntary  Psychosocial Assessment  Patient Complaints None  Eye Contact Fair  Facial Expression Flat  Affect Sad  Speech Logical/coherent  Interaction Guarded  Motor Activity Slow  Appearance/Hygiene Unremarkable  Behavior Characteristics Cooperative  Mood Depressed  Thought Process  Coherency WDL  Content WDL  Delusions None reported or observed  Perception WDL  Hallucination None reported or observed  Judgment Impaired  Confusion None  Danger to Self  Current suicidal ideation? Denies  Agreement Not to Harm Self Yes  Description of Agreement verbal  Danger to Others  Danger to Others None reported or observed

## 2024-04-23 NOTE — Progress Notes (Signed)
 [facility] Inpatient Psychiatry Progress Note  Date: 04/23/2024 Patient: Dennis Owens MRN: [id number]  ASSESSMENT: This patient chronically suffers from posttraumatic stress disorder given his symptoms of reexperiencing, complicated nightmares and withdrawal after witnessing a traumatic event. 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    12/3: Patient agreeable with starting antidepressant today, will begin Zoloft 25 mg daily.  Patient continues to demonstrate minimal insight to his condition and factors that contribute to his mood, continuing encouragement of patient participation in the milieu.  PLAN: Psychiatric Diagnoses and Treatment # Substance-induced mood disorder # PTSD - Continue CIWA protocol with as needed Ativan - Start Zoloft 25 mg daily - Continue thiamine 100 mg daily   PRN's - Trazodone 50 mg at bedtime as needed for insomnia - Atarax 25 mg TID as needed for anxiety - Agitation Protocol: Haldol + Benadryl + Ativan   2. Active Medical Issues #Nicotine withdrawal - Patient does not need nicotine replacement   Other as needed medications  Tylenol 650 mg every 6 hours as needed for pain Mylanta 30 mL every 4 hours as needed for indigestion Milk of magnesia 30 mL daily as needed for constipation  Risk Assessment: Patient continues to require inpatient hospitalization for safety and stabilization of suicidal thoughts.  Discharge Planning: Barriers to Discharge: Medication management Estimated Length of Stay: 2-3 days Predicted Discharge Location: home  INTERVAL HISTORY: Chart reviewed. No significant events overnight, patient continues to score zeros on CIWA.  On assessment today, the patient reports really good mood and good sleep. Discussed with patient most efficacious treatment for  PTSD is combination medication and therapy, and discussed the risks and benefits of several antidepressant medications, ultimately patient agreeable to starting Zoloft.  Patient says his wife visited him last night, and they had a fine discussion.  Patient has no other concerns, somatic complaints and would like to discharge as soon as possible.  Patient denies SI/HI/AVH.  Was finally able to get in contact with Delon Neysa, [phone], patient's wife, however she is at work and request to speak later.  I will give her a call this afternoon, or tomorrow morning before 0830, again.  PRN's in the last 24 hours include: none  Physical Examination:  Vitals and nursing note reviewed MSK: Normal gait and station  MENTAL STATUS EXAM:  Appearance: Casual, white male sitting up in bed   Behavior: Relaxed, maintains good eye contact   Attitude: Cooperative  Speech: Normal tone, prosody, inflection and volume   Mood: Really good  Affect: Congruent, full range  Thought Process: Linear, goal-directed  Thought Content: WNL  SI/HI: Denies  Perceptions: Denies AVH, does not appear to responding to internal stimuli  Judgement: Fair  Insight: Poor  Fund of Knowledge: WNL   Lab Results:  Admission on 04/21/2024  Component Date Value Ref Range Status   Hgb A1c MFr Bld 04/22/2024 5.2  4.8 - 5.6 % Final   Mean Plasma Glucose 04/22/2024 103  mg/dL Final   Cholesterol 87/97/7974 137  0 - 200 mg/dL Final   Triglycerides 87/97/7974 102  <150 mg/dL Final   HDL 87/97/7974 65  >40 mg/dL Final   Total CHOL/HDL Ratio 04/22/2024 2.1  RATIO Final   VLDL 04/22/2024 20  0 - 40 mg/dL Final   LDL Cholesterol 04/22/2024 52  0 - 99 mg/dL Final  Vitals: Blood pressure (!) 125/90, pulse 63, temperature 97.7 F (36.5 C), temperature source Oral, resp. rate 18, height 5' 10 (1.778 m), weight 101.6 kg, SpO2 99%.   Alfornia Light, DO PGY-1, Psychiatry Residency  04/23/2024, 1:06 PM

## 2024-04-23 NOTE — [facility] Group Notes (Signed)
 [facility] Group Notes:  (Nursing/MHT/Case Management/Adjunct)  Date:  04/23/2024  Time:  2000  Type of Therapy:  Narcotics Anonymous Meeting  Participation Level:  Active  Participation Quality:  Appropriate, Attentive, Sharing, and Supportive  Affect:  Appropriate  Cognitive:  Alert  Insight:  Improving  Engagement in Group:  Engaged  Modes of Intervention:  Clarification, Education, and Support  Summary of Progress/Problems:  Dennis Owens 04/23/2024, 9:48 PM

## 2024-04-23 NOTE — Plan of Care (Signed)
   Problem: Education: Goal: Emotional status will improve Outcome: Progressing

## 2024-04-23 NOTE — Group Note (Signed)
 Date:  04/23/2024 Time:  3:22 PM  Group Topic/Focus:  Spirituality:   The focus of this group is to discuss how one's spirituality can aide in recovery.    Participation Level:  Active   Cybele Maule LITTIE [name] 04/23/2024, 3:22 PM

## 2024-04-24 DIAGNOSIS — F4381 Prolonged grief disorder: Secondary | ICD-10-CM | POA: Diagnosis present

## 2024-04-24 MED ORDER — SERTRALINE HCL 50 MG PO TABS
50.0000 mg | ORAL_TABLET | Freq: Every day | ORAL | Status: DC
  Administered 2024-04-24 – 2024-04-25 (×2): 50 mg via ORAL
  Filled 2024-04-24 (×2): qty 1

## 2024-04-24 NOTE — Progress Notes (Signed)
 [facility] Inpatient Psychiatry Progress Note  Date: May 24, 2024 Patient: Dennis Owens MRN: [id number]  ASSESSMENT: This patient's presentation is concerning for posttraumatic stress disorder given his symptoms of complicated nightmares and social withdrawal after witnessing a traumatic event. 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  There is also some suspicion for complicated grief given his preoccupation with the circumstances around his friend's death.  05/24/24: Patient is tolerating Zoloft well and participating in group activities in the milieu. Patient reports good mood and has congruent, full-range affect. Patient continues to demonstrate shallow insight into factors that contribute to his mood, though has many protective factors including positive social support and coping skills. Will increase Zoloft today and plan for discharge home tomorrow with outpatient resources and follow-up.   PLAN: Psychiatric Diagnoses and Treatment # Substance-induced mood disorder # PTSD - Continue CIWA protocol with as needed Ativan - Increase Zoloft to 50 mg daily - Continue thiamine 100 mg daily   PRN's - Trazodone 50 mg at bedtime as needed for insomnia - Atarax 25 mg TID as needed for anxiety - Agitation Protocol: Haldol + Benadryl + Ativan   2. Active Medical Issues #Nicotine withdrawal - Patient does not need nicotine replacement   Other as needed medications  Tylenol 650 mg every 6 hours as needed for pain Mylanta 30 mL every 4 hours as needed for indigestion Milk of magnesia 30 mL daily as needed for constipation  Risk Assessment: Patient continues to require inpatient hospitalization for safety and stabilization of suicidal thoughts.  Discharge Planning: Barriers to Discharge: Medication management Estimated  Length of Stay: 1 day Predicted Discharge Location: home  INTERVAL HISTORY: Chart reviewed. No significant events overnight, patient continues to score zeros on CIWA.  On assessment today, the patient reports good mood and good sleep. 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 He says when he thinks of him, he typically only thinks of the good memories he has of him, thinks of him being goofy, and only occasionally thinks of vivid images of flashbacks to the event. Patient denies sympathetic activation when thinking of these events, denies sweating, palpitations or feeling activated. Patient has no other concerns, somatic complaints and would like to discharge soon.  Patient denies SI/HI/AVH.  Attempted to call Dennis Owens, [phone], patient's wife, last night, twice and went straight to voicemail. I attempted again this morning and was able to leave a VM.  PRN's in the last 24 hours include: none  Physical Examination:  Vitals and nursing note reviewed MSK: Normal gait and station  MENTAL STATUS EXAM:  Appearance: Casual, white male sitting up in bed   Behavior: Relaxed, maintains good eye contact   Attitude: Cooperative  Speech: Normal tone, prosody, inflection and volume   Mood: good  Affect: Congruent, full range  Thought Process: Linear, goal-directed  Thought Content: WNL  SI/HI: Denies  Perceptions: Denies AVH, does not appear to responding to internal stimuli  Judgement: Fair  Insight: Shallow  Fund of Knowledge: WNL   Lab Results:  Admission on 04/21/2024  Component Date Value Ref Range Status   Hgb A1c  MFr Bld 04/22/2024 5.2  4.8 - 5.6 % Final   Mean Plasma Glucose 04/22/2024 103  mg/dL Final   Cholesterol  87/97/7974 137  0 - 200 mg/dL Final   Triglycerides 87/97/7974 102  <150 mg/dL Final   HDL 87/97/7974 65  >40 mg/dL Final   Total CHOL/HDL Ratio 04/22/2024 2.1  RATIO Final   VLDL 04/22/2024 20  0 - 40 mg/dL Final   LDL Cholesterol 04/22/2024 52  0 - 99 mg/dL Final     Vitals: Blood pressure 122/87, pulse 64, temperature (!) 97.5 F (36.4 C), temperature source Oral, resp. rate 18, height 5' 10 (1.778 m), weight 101.6 kg, SpO2 99%.   Alfornia Light, DO PGY-1, Psychiatry Residency  04/24/2024, 7:14 AM

## 2024-04-24 NOTE — Group Note (Signed)
 Date:  04/24/2024 Time:  10:09 AM  Group Topic/Focus:  Goals Group:   The focus of this group is to help patients establish daily goals to achieve during treatment and discuss how the patient can incorporate goal setting into their daily lives to aide in recovery.    Participation Level:  Did Not Attend  Participation Quality:  Did Not Attend  Affect:  Did Not Attend  Cognitive:  Did Not Attend  Insight: None  Engagement in Group:  Did Not Attend  Modes of Intervention:  Did Not Attend  Additional Comments:  Did Not Attend  Avelina DELENA Humphreys 04/24/2024, 10:09 AM

## 2024-04-24 NOTE — Plan of Care (Signed)
   Problem: Education: Goal: Emotional status will improve Outcome: Progressing Goal: Mental status will improve Outcome: Progressing

## 2024-04-24 NOTE — Progress Notes (Signed)
(  Sleep Hours) -6.5 (Any PRNs that were needed, meds refused, or side effects to meds)- none (Any disturbances and when (visitation, over night)-none (Concerns raised by the patient)- none (SI/HI/AVH)-denied

## 2024-04-24 NOTE — Progress Notes (Signed)
 D. Pt has been pleasant upon approach, smiles, voices no complaints, has been visible in the milieu, observed interacting well with peers. Pt reported sleeping well last night, described his appetite as 'good', energy level as 'normal', and concentration as 'good'. Per pt' self inventory,pt rated his depression,hopelessness and anxiety all 0's. Pt's stated goal today is to work on exit plan.   Pt currently denies SI/HI and AVH and doesn't appear to be responding to internal stimuli. A. Labs and vitals monitored. Pt given and educated on medications. Pt supported emotionally and encouraged to express concerns and ask questions.   R. Pt remains safe with 15 minute checks. Will continue POC.

## 2024-04-24 NOTE — Group Note (Signed)
 Occupational Therapy Group Note  Group Topic:Coping Skills  Group Date: 04/24/2024 Start Time: 1500 End Time: 1530 Facilitators: Dot Dallas MATSU, OT   Group Description: Group encouraged increased engagement and participation through discussion and activity focused on Coping Ahead. Patients were split up into teams and selected a card from a stack of positive coping strategies. Patients were instructed to act out/charade the coping skill for other peers to guess and receive points for their team. Discussion followed with a focus on identifying additional positive coping strategies and patients shared how they were going to cope ahead over the weekend while continuing hospitalization stay.  Therapeutic Goal(s): Identify positive vs negative coping strategies. Identify coping skills to be used during hospitalization vs coping skills outside of hospital/at home Increase participation in therapeutic group environment and promote engagement in treatment   Participation Level: Engaged   Participation Quality: Independent   Behavior: Appropriate   Speech/Thought Process: Relevant   Affect/Mood: Appropriate   Insight: Fair   Judgement: Fair      Modes of Intervention: Education  Patient Response to Interventions:  Attentive   Plan: Continue to engage patient in OT groups 2 - 3x/week.  04/24/2024  D[place] MATSU Dot, OT  Samai Corea, OT

## 2024-04-24 NOTE — Group Note (Signed)
 Date:  04/24/2024 Time:  4:04 PM  Group Topic/Focus: Occupational Therapy Healthy Communication:   The focus of this group is to discuss communication, barriers to communication, as well as healthy ways to communicate with others.    Participation Level:  Active   Dennis Owens 04/24/2024, 4:04 PM

## 2024-04-24 NOTE — Group Note (Signed)
 LCSW Group Therapy Note   Group Date: 04/24/2024 Start Time: 1100 End Time: 1200   Participation:  patient was present and actively participated in the discussion.  Patient was insightful.    Type of Therapy:  Group Therapy  Topic:  Money Matters: Creating Stability, Confidence and Peace of Mind  Objective: To help participants understand the impact of financial stability on well-being through the lens of Maslow's Hierarchy of Needs and develop practical strategies for budgeting, saving, and debt repayment.  Goals: Increase awareness of spending habits and financial priorities, recognizing how money supports basic needs, security, and relationships. Develop simple budgeting and saving strategies to enhance stability and peace of mind.  Reduce financial stress by creating a realistic debt repayment plan, supporting long-term confidence and well-being.  Summary:  Participants explored how financial stability connects to basic needs, relationships, and self-esteem using Maslow's Hierarchy. They discussed budgeting, saving, and debt repayment strategies, identifying small, manageable changes. Through interactive discussion and self-reflection, they gained insight into their financial habits and created personal action steps for improvement.  Therapeutic Modalities Used: Elements of Cognitive Behavioral Therapy (CBT) - Addressing financial stress and thought patterns. Psychoeducation - Engineer, agricultural. Elements of Motivational Interviewing (MI) - Encouraging realistic, achievable changes. Group Support - Reducing shame and stress through shared experiences.   Dennis Owens, LCSWA 04/24/2024  12:24 PM

## 2024-04-24 NOTE — Group Note (Signed)
 Date:  04/24/2024 Time:  7:07 PM  Group Topic/Focus: Emotional  Emotional Education:   The focus of this group is to discuss what feelings/emotions are, and how they are experienced.  The group began with an emotional check-in and pulse check as an research scientist (life sciences). Patients then participated in an Emotion Bingo activity, where they were provided bingo cards listing various emotions. Emotions were called at random, and patients selected emotions to discuss how each feeling affects them and the actions or behaviors linked to that emotion. A gold star was awarded for bingo completion, and prompting questions such as "Have you ever experienced this emotion?" were used to encourage reflection and sharing. The group discussion highlighted the universality of emotions, the differences in how they may appear, and the importance of giving grace to others. In conclusion, patients were reminded that naming emotions increases awareness and provides the power to control how emotions are expressed.  Participation Level:  Did Not Attend  Participation Quality:  Appropriate  Affect:  Appropriate  Cognitive:  Alert  Insight: Appropriate  Engagement in Group:  Engaged  Modes of Intervention:  Activity  Additional Comments:    Arijana Narayan N Anari Evitt 04/24/2024, 7:07 PM

## 2024-04-24 NOTE — Progress Notes (Signed)
  [facility] Adult Case Management Discharge Plan :  Will you be returning to the same living situation after discharge:  Yes,  Pt will return home with wife. At discharge, do you have transportation home?: Yes,  Pt will be picked up by BETHANIE Wellington Novak, [phone] on Friday, Dec. 5, 2025 at 10:45 AM. Do you have the ability to pay for your medications: Yes,  Pt has medical insurance as well as VA benefits.  Release of information consent forms completed and in the chart;  Patient's signature needed at discharge.  Patient to Follow up at:  Follow-up Information     [facility] Go on 05/01/2024.   Why: You have an appointment for therapy services on 05/01/24 at 11:00 am with Lamar, in person.  Intake paperwork will be sent to your email.  Please submit 48 hours prior to your appointment, if possible. Contact information: [address] [place] [address] [phone]         [facility] Schedule an appointment as soon as possible for a visit.   Contact information: [address] [facility] [place] [address] [phone]         [facility] Follow up on 05/08/2024.   Why: You have an appointment for medication management services on 05/08/24 at 2:00 pm.  This will be a Virtual appointment, but you may call to change to in person. Contact information: [address] [place] [address] [phone]                 Next level of care provider has access to [facility] Link:no  Safety Planning and Suicide Prevention discussed: Yes,  Discussed with MIL, Wellington Novak.  No concerns voiced for pt safety or the safety of others upon his discharge.    Has patient been referred to the Quitline?: Patient does not use tobacco/nicotine products  Patient has been referred for addiction treatment: No known substance use disorder.  Derick JONELLE Blanch, LCSW 04/24/2024, 11:09 AM

## 2024-04-24 NOTE — Group Note (Signed)
 Date:  04/24/2024 Time:  10:14 PM  Group Topic/Focus:  Wrap-Up Group:   The focus of this group is to help patients review their daily goal of treatment and discuss progress on daily workbooks.    Participation Level:  Active  Participation Quality:  Appropriate  Affect:  Appropriate  Cognitive:  Appropriate  Insight: Appropriate and Good  Engagement in Group:  Engaged  Modes of Intervention:  Discussion  Additional Comments:  Patient stated that he had a great day it was a 9 out of 10.  Patient stated that he is working  on his exit plan  Bari Moats 04/24/2024, 10:14 PM

## 2024-04-24 NOTE — [facility] Suicide Risk Assessment (Signed)
 [facility] INPATIENT:  Family/Significant Other Suicide Prevention Education  Suicide Prevention Education:  Education Completed; Wellington Novak (MIL), [phone] (name of family member/significant other) has been identified by the patient as the family member/significant other with whom the patient will be residing, and identified as the person(s) who will aid the patient in the event of a mental health crisis (suicidal ideations/suicide attempt).  With written consent from the patient, the family member/significant other has been provided the following suicide prevention education, prior to the and/or following the discharge of the patient.  Deena reported no concerns with pt safety upon discharge.  She denied that he was a threat to himself or others.  Does indicate presence of weapons but they will be secured upon his arrival home.  The suicide prevention education provided includes the following: Suicide risk factors Suicide prevention and interventions National Suicide Hotline telephone number [facility] assessment telephone number [facility] Emergency Assistance 911 [facility] and/or Residential Mobile Crisis Unit telephone number  Request made of family/significant other to: Remove weapons (e.g., guns, rifles, knives), all items previously/currently identified as safety concern.   Remove drugs/medications (over-the-counter, prescriptions, illicit drugs), all items previously/currently identified as a safety concern.  The family member/significant other verbalizes understanding of the suicide prevention education information provided.  The family member/significant other agrees to remove the items of safety concern listed above.  Derick JONELLE Blanch, LCSW 04/24/2024, 11:07 AM

## 2024-04-25 MED ORDER — SERTRALINE HCL 50 MG PO TABS: 50.0000 mg | ORAL_TABLET | Freq: Every day | ORAL | 0 refills | Status: AC

## 2024-04-25 NOTE — Progress Notes (Signed)
(  Sleep Hours) -7.5 (Any PRNs that were needed, meds refused, or side effects to meds)- none (Any disturbances and when (visitation, over night)-none (Concerns raised by the patient)- none (SI/HI/AVH)-denied

## 2024-04-25 NOTE — [facility] Suicide Risk Assessment (Signed)
 Suicide Risk Assessment  Discharge Assessment    [facility] Discharge Suicide Risk Assessment   Principal Problem: Major depressive disorder, recurrent severe without psychotic features (HCC) Discharge Diagnoses: Principal Problem:   Major depressive disorder, recurrent severe without psychotic features (HCC) Active Problems:   PTSD (post-traumatic stress disorder)   Complicated grief  Musculoskeletal: Strength & Muscle Tone: within normal limits Gait & Station: normal Patient leans: N/A  Psychiatric Specialty Exam  Presentation  General Appearance:  Appropriate for Environment  Eye Contact: Good  Speech: Clear and Coherent; Normal Rate  Speech Volume: Normal  Handedness:No data recorded  Mood and Affect  Mood: Euphoric  Duration of Depression Symptoms: Greater than two weeks  Affect: Congruent; Appropriate   Thought Process  Thought Processes: Coherent; Goal Directed; Linear  Descriptions of Associations:Intact  Orientation:Full (Time, Place and Person)  Thought Content:WDL; Logical  History of Schizophrenia/Schizoaffective disorder:No  Duration of Psychotic Symptoms:No data recorded Hallucinations:Hallucinations: None  Ideas of Reference:None  Suicidal Thoughts:Suicidal Thoughts: No  Homicidal Thoughts:Homicidal Thoughts: No   Sensorium  Memory: Immediate Good  Judgment: Fair  Insight: Poor   Executive Functions  Concentration: Good  Attention Span: Good  Recall: Good  Fund of Knowledge: Good  Language: Good   Psychomotor Activity  Psychomotor Activity:Psychomotor Activity: Normal   Assets  Assets: Communication Skills; Physical Health; Resilience; Social Support; Talents/Skills; Vocational/Educational   Sleep  Sleep:Sleep: Good  Estimated Sleeping Duration (Last 24 Hours): 6.50-7.00 hours  Physical Exam: Physical Exam Constitutional:      General: He is not in acute distress.    Appearance: He is not  ill-appearing, toxic-appearing or diaphoretic.  Pulmonary:     Effort: Pulmonary effort is normal.  Neurological:     General: No focal deficit present.     Mental Status: He is alert.    Review of Systems  Gastrointestinal:  Positive for abdominal pain.  Neurological:  Negative for dizziness and headaches.  Psychiatric/Behavioral:  Negative for hallucinations and suicidal ideas.    Blood pressure 124/80, pulse (!) 59, temperature 97.8 F (36.6 C), temperature source Oral, resp. rate 16, height 5' 10 (1.778 m), weight 101.6 kg, SpO2 99%. Body mass index is 32.14 kg/m.  Mental Status Per Nursing Assessment::   On Admission:  NA  Demographic Factors:  Male, Caucasian, and Access to firearms  Loss Factors: Loss of significant relationship  Historical Factors: Anniversary of important loss and Impulsivity  Risk Reduction Factors:   Responsible for children under 94 years of age, Sense of responsibility to family, Living with another person, especially a relative, Positive social support, Positive therapeutic relationship, and Positive coping skills or problem solving skills  Continued Clinical Symptoms:  Depression:   Comorbid alcohol abuse/dependence Impulsivity More than one psychiatric diagnosis  Cognitive Features That Contribute To Risk:  None    Suicide Risk:  Moderate:  Frequent suicidal ideation with limited intensity, and duration, some specificity in terms of plans, no associated intent, good self-control, limited dysphoria/symptomatology, some risk factors present, and identifiable protective factors, including available and accessible social support.  Patient is at chronically elevated suicide risk given unmodifiable risk factors such as male sex, Caucasian ethnicity and military veteran with ongoing relationship difficulties, though has positive coping skills and social support, lowering risk.   Follow-up Information     [facility] Go on  05/01/2024.   Why: You have an appointment for therapy services on 05/01/24 at 11:00 am with Lamar, in person.  Intake paperwork will be sent to your email.  Please submit 48 hours prior to your appointment, if possible. Contact information: [address] [place] [address] [phone]         [facility] Schedule an appointment as soon as possible for a visit.   Contact information: [address] [facility] [place] [address] [phone]         [facility] Follow up on 05/08/2024.   Why: You have an appointment for medication management services on 05/08/24 at 2:00 pm.  This will be a Virtual appointment, but you may call to change to in person. Contact information: [address] [place] [address] [phone]                 Plan Of Care/Follow-up recommendations:  Activity: as tolerated  Diet: heart healthy  Other: -Follow-up with your outpatient psychiatric provider -instructions on appointment date, time, and address (location) are provided to you in discharge paperwork.  -Take your psychiatric medications as prescribed at discharge - instructions are provided to you in the discharge paperwork  -Recommend abstinence from alcohol, tobacco, and other illicit drug use at discharge.   -If your psychiatric symptoms recur, worsen, or if you have side effects to your psychiatric medications, call your outpatient psychiatric provider, 911, [phone] or go to the nearest emergency department.  -If suicidal thoughts recur, call your outpatient psychiatric provider, 911, [phone] or go to the nearest emergency department.   Jaysie Benthall, DO 04/25/2024, 8:00 AM

## 2024-04-25 NOTE — Plan of Care (Signed)
  Problem: Education: Goal: Mental status will improve Outcome: Progressing   

## 2024-04-25 NOTE — Progress Notes (Signed)
 Dennis Owens Salt II  D/C'd Home per MD order.  Discussed with the patient and all questions fully answered.  An After Visit Summary was printed and given to the patient. Patient received prescription.  D/c education completed with patient including follow up instructions, medication list, d/c activities limitations if indicated, with other d/c instructions as indicated by MD - patient able to verbalize understanding, all questions fully answered.   Patient instructed to return to ED, call 911, or call MD for any changes in condition.   Patient escorted to the main entrance, and D/C home via private auto.  [name] O [name] 04/25/2024 1:32 PM

## 2024-04-25 NOTE — Group Note (Signed)
 Date:  04/25/2024 Time:  9:56 AM  Group Topic/Focus:  Goals Group:   The focus of this group is to help patients establish daily goals to achieve during treatment and discuss how the patient can incorporate goal setting into their daily lives to aide in recovery. Orientation:   The focus of this group is to educate the patient on the purpose and policies of crisis stabilization and provide a format to answer questions about their admission.  The group details unit policies and expectations of patients while admitted.    Participation Level:  Did Not Attend   Dennis Owens 04/25/2024, 9:56 AM

## 2024-04-25 NOTE — Discharge Summary (Signed)
 Physician Discharge Summary Note  Patient:  Dennis Owens is an 38 y.o., male MRN:  [id number] DOB:  08/14/1985 Patient phone:  [phone] (home)  Patient address:   [address] [place] [address],   Date of Admission:  04/21/2024 Date of Discharge: 04/25/2024  Reason for Admission: Dennis Owens is a 38 y.o. male  with a past psychiatric history of PTSD. Patient initially arrived to [facility] on 12/1 for AMS and was admitted to [facility] under IVC on 12/1 for acute safety concerns, crisis stabalization, acute suicidal or self-harming behaviors, and substance related issues.    Discharge Diagnoses:  Principal Problem:   Major depressive disorder, recurrent severe without psychotic features (HCC) Active Problems:   PTSD (post-traumatic stress disorder)   Complicated grief  Hospital Course:   Upon admission, the patient presented with recent suicidal behavior in the context of alcohol intoxication and was started on the following medications: Zoloft . Initial medication adjustments included titrated Zoloft , and baseline labs and imaging were reviewed, with the following abnormalities noted for follow-up: Mildly elevated LFTs.  The treatment plan was reviewed daily in interdisciplinary meetings. Ongoing medication management resulted in further adjustments: Titration of Zoloft , with final dosing optimized prior to discharge. The patient endorses the following side effects to prescribed psychiatric medication: Abdominal discomfort. The patient engaged in group programming focusing on coping skills, problem-solving, relaxation techniques, and also received supportive psychotherapy.  Over the course of hospitalization, the patient acclimated to the unit milieu and showed steady improvement in mood, affect, sleep, appetite, and participation in programming. Daily self-inventories reflected ongoing symptom reduction and increased treatment engagement.  Leading up to discharge, the  patient reported stable mood, denied suicidal or homicidal ideation for more than 48 hours, and denied hallucinations or other psychotic symptoms. On day of discharge, patient reported good, mood, continued to deny suicidality and expressed his excitement with returning home to his children. The patient expressed motivation to continue prescribed medications and follow-up care, noting good response of target symptoms of suicidality and overall benefit from hospitalization. The patient was able to verbalize an individualized safety plan prior to discharge.  Mental Status Exam: Appearance: Appropriate for environment, casual, standing in hallway Behavior: Good eye contact, cooperative Attitude: Cooperative Speech: Normal volume, tone and inflection Mood: Good Affect: Congruent, full range Thought Process: Linear, GD Thought Content: WNL SI/HI: Denies Perceptions: Denies AVH, does not appear to responding to internal stimuli Judgement: Fair Insight: Poor Fund of Knowledge: WNL  Physical Exam  General: Pleasant, well-appearing white male. No acute distress. Pulmonary: Normal effort on room air.  Skin: No obvious rash or lesions. Neuro: A&Ox3.No focal deficits. MSK: Normal gait and station.  Review of Systems  Endorses some abdominal discomfort, denies diarrhea, nausea, constipation, headache or dizziness  Blood pressure 124/80, pulse (!) 59, temperature 97.8 F (36.6 C), temperature source Oral, resp. rate 16, height 5' 10 (1.778 m), weight 101.6 kg, SpO2 99%. Body mass index is 32.14 kg/m.  Assets  Assets: Resilience, physical health, positive social support  Social History   Tobacco Use  Smoking Status Never  Smokeless Tobacco Never   Tobacco Cessation:  N/A, patient does not currently use tobacco products  Metabolic Disorder Labs:  Lab Results  Component Value Date   HGBA1C 5.2 04/22/2024   MPG 103 04/22/2024   No results found for: PROLACTIN Lab Results   Component Value Date   CHOL 137 04/22/2024   TRIG 102 04/22/2024   HDL 65 04/22/2024  CHOLHDL 2.1 04/22/2024   VLDL 20 04/22/2024   LDLCALC 52 04/22/2024   LDLCALC 74 10/03/2021     Is patient on multiple antipsychotic therapies at discharge:  No   Has Patient had three or more failed trials of antipsychotic monotherapy by history:  No  Recommended Plan for Multiple Antipsychotic Therapies: NA   Allergies as of 04/25/2024       Reactions   Penicillins Rash        Medication List     TAKE these medications      Indication  sertraline  50 MG tablet Commonly known as: ZOLOFT  Take 1 tablet (50 mg total) by mouth daily.  Indication: Major Depressive Disorder        Follow-up Information     [facility] Go on 05/01/2024.   Why: You have an appointment for therapy services on 05/01/24 at 11:00 am with Lamar, in person.  Intake paperwork will be sent to your email.  Please submit 48 hours prior to your appointment, if possible. Contact information: [address] [place] [address] [phone]         [facility] Schedule an appointment as soon as possible for a visit.   Contact information: [address] [facility] [place] [address] [phone]         [facility] Follow up on 05/08/2024.   Why: You have an appointment for medication management services on 05/08/24 at 2:00 pm.  This will be a Virtual appointment, but you may call to change to in person. Contact information: [address] [place] [address] [phone]                 Discharge recommendations:  Continue psychiatric medications as prescribed. Follow up with outpatient psychiatric provider and primary care physician as scheduled. Follow-up for abnormal lab results: Elevated LFTs Follow-up for management of chronic disease: N/A Abstain from or limit alcohol, illicit drugs, and tobacco due to their  negative impact on psychiatric and medical health. In the event of worsening symptoms, the patient is instructed to call the national crisis hotline (988), 911, or go the the closest ED for appropriate evaluation and treatment of symptoms.    Alfornia Light, DO, PGY-1 04/25/2024, 12:06 PM

## 2024-04-25 NOTE — Plan of Care (Signed)
  Problem: Education: Goal: Knowledge of [facility] Education information/materials will improve Outcome: Completed/Met   Problem: Education: Goal: Emotional status will improve Outcome: Completed/Met   Problem: Activity: Goal: Interest or engagement in activities will improve Outcome: Completed/Met

## 2024-04-25 NOTE — Group Note (Signed)
 Recreation Therapy Group Note   Group Topic:Communication  Group Date: 04/25/2024 Start Time: 0933 End Time: 1025 Facilitators: Shanautica Forker-McCall, LRT,CTRS Location: [address] Dayroom   Group Topic: Communication, Problem Solving   Goal Area(s) Addresses:  Patient will effectively listen to complete activity.  Patient will identify communication skills used to make activity successful.  Patient will identify how skills used during activity can be used to reach post d/c goals.    Behavioral Response: Engaged   Intervention: Building Surveyor Activity - Geometric pattern cards, pencils, blank paper    Activity: Geometric Drawings.  Three volunteers from the peer group will be shown an abstract picture with a particular arrangement of geometrical shapes.  Each round, one 'speaker' will describe the pattern, as accurately as possible without revealing the image to the group.  The remaining group members will listen and draw the picture to reflect how it is described to them. Patients with the role of 'listener' cannot ask clarifying questions but, may request that the speaker repeat a direction. Once the drawings are complete, the presenter will show the rest of the group the picture and compare how close each person came to drawing the picture. LRT will facilitate a post-activity discussion regarding effective communication and the importance of planning, listening, and asking for clarification in daily interactions with others.  Education: Environmental consultant, Active listening, Support systems, Discharge planning  Education Outcome: Acknowledges understanding/In group clarification offered/Needs additional education.    Affect/Mood: Appropriate   Participation Level: Engaged   Participation Quality: Independent   Behavior: Appropriate   Speech/Thought Process: Focused   Insight: Good   Judgement: Good   Modes of Intervention: Activity   Patient Response to  Interventions:  Engaged   Education Outcome:  In group clarification offered    Clinical Observations/Individualized Feedback: Pt was bright and focused during activity. Pt would give hand gestures to get the attention of presenters to get them to identify whether a shape was horizontal or vertical.     Plan: Continue to engage patient in RT group sessions 2-3x/week.   Katrina Daddona-McCall, LRT,CTRS 04/25/2024 12:27 PM

## 2024-04-25 NOTE — Group Note (Signed)
 Date:  04/25/2024 Time:  10:41 AM  Group Topic/Focus: Recreational Therapy    Pt did attend recreational therapy group  Dennis Owens R Brinsley Wence 04/25/2024, 10:41 AM

## 2024-04-25 NOTE — Group Note (Signed)
 Date:  04/25/2024 Time:  12:59 PM  Group Topic/Focus: Social Wellness Fostering connection, teamwork, communication, and stress-relief through a fun and engaging group activity. Enhance Social Connection: Encourage participants to interact, communicate, and bond in a fun, low-pressure environment. Promote Teamwork and Collaboration: Jerrye cooperative problem-solving as players work together to united stationers and support their teammates. Boost Communication Skills: Improve non-verbal communication, creativity, and active listening through drawing and interpreting illustrations. Increase Engagement and Participation: Provide an enjoyable activity that motivates all group members to participate regardless of drawing ability. Encourage Stress Relief and Playfulness: Create a relaxed, enjoyable atmosphere that supports emotional well-being through laughter and play. Strengthen Group Cohesion: Build trust and camaraderie, helping members feel more comfortable and connected within the group.  Participation Level:  Active  Participation Quality:  Appropriate  Affect:  Appropriate  Cognitive:  Appropriate  Insight: Appropriate  Engagement in Group:  Engaged  Modes of Intervention:  Activity  Additional Comments:  N/A  Dennis Owens 04/25/2024, 12:59 PM

## 2024-04-26 ENCOUNTER — Ambulatory Visit (HOSPITAL_COMMUNITY)
Admission: EM | Admit: 2024-04-26 | Discharge: 2024-04-27 | Disposition: A | Attending: Family Medicine | Admitting: Family Medicine

## 2024-04-26 DIAGNOSIS — Z9151 Personal history of suicidal behavior: Secondary | ICD-10-CM | POA: Insufficient documentation

## 2024-04-26 DIAGNOSIS — F4310 Post-traumatic stress disorder, unspecified: Secondary | ICD-10-CM | POA: Diagnosis not present

## 2024-04-26 DIAGNOSIS — Z630 Problems in relationship with spouse or partner: Secondary | ICD-10-CM | POA: Insufficient documentation

## 2024-04-26 DIAGNOSIS — R4182 Altered mental status, unspecified: Secondary | ICD-10-CM | POA: Diagnosis not present

## 2024-04-26 DIAGNOSIS — Z79899 Other long term (current) drug therapy: Secondary | ICD-10-CM | POA: Diagnosis not present

## 2024-04-26 MED ORDER — SERTRALINE HCL 50 MG PO TABS
50.0000 mg | ORAL_TABLET | Freq: Every day | ORAL | Status: DC
  Administered 2024-04-26 – 2024-04-27 (×2): 50 mg via ORAL
  Filled 2024-04-26 (×2): qty 1

## 2024-04-26 MED ORDER — OLANZAPINE 10 MG IM SOLR: 5.0000 mg | Freq: Three times a day (TID) | INTRAMUSCULAR | Status: DC | PRN

## 2024-04-26 MED ORDER — ACETAMINOPHEN 325 MG PO TABS: 650.0000 mg | ORAL_TABLET | Freq: Four times a day (QID) | ORAL | Status: DC | PRN

## 2024-04-26 MED ORDER — MAGNESIUM HYDROXIDE 400 MG/5ML PO SUSP: 30.0000 mL | Freq: Every day | ORAL | Status: DC | PRN

## 2024-04-26 MED ORDER — TRAZODONE HCL 50 MG PO TABS: 50.0000 mg | ORAL_TABLET | Freq: Every evening | ORAL | Status: DC | PRN

## 2024-04-26 MED ORDER — OLANZAPINE 5 MG PO TBDP: 5.0000 mg | ORAL_TABLET | Freq: Three times a day (TID) | ORAL | Status: DC | PRN

## 2024-04-26 MED ORDER — ALUM & MAG HYDROXIDE-SIMETH 200-200-20 MG/5ML PO SUSP: 30.0000 mL | ORAL | Status: DC | PRN

## 2024-04-26 MED ORDER — OLANZAPINE 10 MG IM SOLR: 10.0000 mg | Freq: Three times a day (TID) | INTRAMUSCULAR | Status: DC | PRN

## 2024-04-26 NOTE — BH Assessment (Signed)
 Comprehensive Clinical Assessment (CCA) Note  04/26/2024 Dennis Owens [id number]  Disposition: Per Sherrell Culver NP, patient will be observed and monitored in continuous assessment.    The patient demonstrates the following risk factors for suicide: Chronic risk factors for suicide include: psychiatric disorder of PTSD. Acute risk factors for suicide include: family or marital conflict. Protective factors for this patient include: positive therapeutic relationship. Considering these factors, the overall suicide risk at this point appears to be low. Patient is appropriate for outpatient follow up.   Is a 38 year old male who presents to [facility] under involuntary commitment petition.  Per the petition:  The respondent has a history of prior commitment.  The respondent has recently attempted suicide and has left the house without a coat and barely any clothing in 30 degree weather.  The respondent has a history of alcohol abuse and currently drinks every day.  The respondent is hostile and aggressive in his actions towards family members and is an immediate threat.  The respondent has firearms and has brandished them toward family members.  The respondent is a danger to himself and others.  Per triage note: Dennis Owens 38y male arrived to [facility] under IVC, petitioned by his wife. Please review IVC attachment. PT was calm and cooperative during triage denying SI, HI, AVH and alcohol and substance use. Per IVC, the pt recently attempted suicide and left his house in 30 degree weather w/ barely any clothes on. PT has hx of alcohol abuse and currently drinks everyday, pt is hostile and aggressive in his actions towards family members and is an immediate threat, pt has firearms and has brandished them to family members per IVC.  [facility].  Patient's states that he went to his home and refused to be and has been home  alone since last night and today he was sitting on the couch and the sheriff came and picked him up and brought him here.  Patient denies having any SI, or any prior suicide attempts, HI, AVH.  He also denies using any alcohol or illicit substances.  Patient denies any other information presented and IVC petition.  Patient admits that although those things happen none of them have happened in the past 24 hours since he has been out of the hospital everything was prior to his hospitalization.  Patient reports that he and his wife have had some mild to problems and has been both been going to counseling to attempt to work through their problems.  Collateral was obtained from patient's wife Delon.  Delon reports that she does not feel safe in the presence of patient.  She states that he ransacked the house on yesterday looking for the keys to the gun safe.  Patient stated however he was looking to see if his wife was still taking medication for an STD.  Patient denies ever brandishing a gun in the house.  He states that he did leave it out 1 time accidentally which he admits he should not have done with them having children in the home.  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  Patient's mother passed away 10/08/25th, 2025 and patient is still grieving the loss of his mother.  Patient is casually dressed, alert and oriented x 5.  His speech is clear and coherent with  normal tone and volume.  The patient is calm yet tearful.  Patient's mood is depressed and anxious with congruent affect.  The patient's thought process is coherent and relevant.  There is no indication that the patient is currently responding to internal stimuli or experiencing delusional thought content.  The patient was cooperative throughout assessment   Chief Complaint:  Chief Complaint  Patient presents with    IVC   Visit Diagnosis: Adjustment Disorder    CCA Screening, Triage and Referral (STR)  Patient Reported Information How did you hear about us ? Legal System  What Is the Reason for Your Visit/Call Today? Dennis Owens 38y male arrived to [facility] under IVC, petitioned by his wife. Please review IVC attachment. PT was calm and cooperative during triage denying SI, HI, AVH and alcohol and substance use. Per IVC, the pt recently attempted suicide and left his house in 30 degree weather w/ barely any clothes on. PT has hx of alcohol abuse and currently drinks everyday, pt is hostile and aggressive in his actions towards family members and is an immediate threat, pt has firearms and has brandished them to family members per IVC.  How Long Has This Been Causing You Problems? <Week  What Do You Feel Would Help You the Most Today? Treatment for Depression or other mood problem   Have You Recently Had Any Thoughts About Hurting Yourself? No  Are You Planning to Commit Suicide/Harm Yourself At This time? No   Flowsheet Row ED from 04/26/2024 in [facility] Most recent reading at 04/26/2024  3:24 PM Ad[facility] (Dis[facility] Most recent reading at 04/21/2024 11:00 AM ED from 04/21/2024 in [facility] Most recent reading at 04/21/2024  6:50 AM  C-SSRS RISK CATEGORY No Risk High Risk High Risk    Have you Recently Had Thoughts About Hurting Someone Sherral? No  Are You Planning to Harm Someone at This Time? No  Explanation: Pt denies   Have You Used Any Alcohol or Drugs in the Past 24 Hours? No  How Long Ago Did You Use Drugs or Alcohol? Last Sunday What Did You Use and How Much? Amount unknown  Do You Currently Have a Therapist/Psychiatrist? Yes  Name of Therapist/Psychiatrist: Name of Therapist/Psychiatrist: Marinell Hileman/ marriage counselor   Have You Been  Recently Discharged From Any Public Relations Account Executive or Programs? Yes  Explanation of Discharge From Practice/Program: Was dicharged from [facility] on yesterday     CCA Screening Triage Referral Assessment Type of Contact: Face-to-Face  Telemedicine Service Delivery:   Is this Initial or Reassessment? Is this Initial or Reassessment?: Initial Assessment  Date Telepsych consult ordered in [facility]:    Time Telepsych consult ordered in [facility]:    Location of Assessment: [facility] Assessment Services  Provider Location: [facility] Assessment Services   Collateral Involvement: IVC/Wife Kortland Nichols [phone]   Does Patient Have a Court Appointed Legal Guardian? No  Legal Guardian Contact Information: N/A  Copy of Legal Guardianship Form: -- (N/A)  Legal Guardian Notified of Arrival: -- (N/A)  Legal Guardian Notified of Pending Discharge: -- (N/A)  If Minor and Not Living with Parent(s), Who has Custody? N/A  Is CPS involved or ever been involved? Never  Is APS involved or ever been involved? Never   Patient Determined To Be At Risk for Harm To Self or Others Based on Review of Patient Reported Information or Presenting Complaint? No  Method: No Plan  Availability of Means: No access or NA  Intent: Vague intent or NA  Notification Required: No need or identified person  Additional Information for Danger to Others Potential: -- (N/A)  Additional Comments for Danger to Others Potential: N/A  Are There Guns or Other Weapons in Your Home? Yes  Types of Guns/Weapons: Pistols, rifles, shotguns  Are These Weapons Safely Secured?                            Yes  Who Could Verify You Are Able To Have These Secured: patine's wife Noelle Hoogland  Do You Have any Outstanding Charges, Pending Court Dates, Parole/Probation? Patient denies  Contacted To Inform of Risk of Harm To Self or Others: Patent Examiner; Family/Significant Other:    Does Patient Present under Involuntary Commitment?  Yes    [place] of Residence: [place]   Patient Currently Receiving the Following Services: Individual Therapy   Determination of Need: Urgent (48 hours)   Options For Referral: [facility]; Outpatient Therapy     CCA Biopsychosocial Patient Reported Schizophrenia/Schizoaffective Diagnosis in Past: No   Strengths: Patient is willing to seek treatment   Mental Health Symptoms Depression:  Change in energy/activity; Fatigue; Irritability; Tearfulness   Duration of Depressive symptoms: Duration of Depressive Symptoms: Less than two weeks   Mania:  Recklessness; Irritability   Anxiety:   Worrying; Tension   Psychosis:  None   Duration of Psychotic symptoms:    Trauma:  None   Obsessions:  N/A   Compulsions:  N/A   Inattention:  N/A   Hyperactivity/Impulsivity:  N/A   Oppositional/Defiant Behaviors:  N/A   Emotional Irregularity:  None   Other Mood/Personality Symptoms:  N/A    Mental Status Exam Appearance and self-care  Stature:  Average   Weight:  Average weight   Clothing:  Casual   Grooming:  Normal   Cosmetic use:  None   Posture/gait:  Normal   Motor activity:  Not Remarkable   Sensorium  Attention:  Normal   Concentration:  Normal   Orientation:  X5   Recall/memory:  Normal   Affect and Mood  Affect:  Depressed; Tearful   Mood:  Depressed   Relating  Eye contact:  Normal   Facial expression:  Depressed; Sad   Attitude toward examiner:  Cooperative   Thought and Language  Speech flow: Clear and Coherent   Thought content:  Appropriate to Mood and Circumstances   Preoccupation:  None   Hallucinations:  None   Organization:  Coherent   Affiliated [employer] Services of Knowledge:  Average   Intelligence:  Average   Abstraction:  Functional   Judgement:  Fair   Dance Movement Psychotherapist:  Adequate   Insight:  Fair   Decision Making:  Impulsive   Social Functioning  Social Maturity:  Isolates   Social Judgement:   Normal   Stress  Stressors:  Family conflict; Grief/losses   Coping Ability:  Human Resources Officer Deficits:  Decision making   Supports:  Support needed     Religion: Religion/Spirituality Are You A Religious Person?: Yes What is Your Religious Affiliation?: Baptist How Might This Affect Treatment?: N/A  Leisure/Recreation: Leisure / Recreation Do You Have Hobbies?: Yes Leisure and Hobbies: Sports, woodworking  Exercise/Diet: Exercise/Diet Do You Exercise?: No Have You Gained or Lost A Significant Amount of Weight in the Past Six Months?: No Do You Follow a Special Diet?:  No Do You Have Any Trouble Sleeping?: No   CCA Employment/Education Employment/Work Situation: Employment / Work Situation Employment Situation: Employed Work Stressors: none reported Patient's Job has Been Impacted by Current Illness: No Has Patient ever Been in Equities Trader?: Yes (Describe in comment) (Pt spent 8 years in the [employer]) Did You Receive Any Psychiatric Treatment/Services While in the Military?: Yes Type of Psychiatric Treatment/Services in Military: Ws evaluated for PTSD hile serving  Education: Education Is Patient Currently Attending School?: No Last Grade Completed: 12 Did You Product Manager?: No Did You Have An Individualized Education Program (IIEP): No Did You Have Any Difficulty At School?: No Patient's Education Has Been Impacted by Current Illness: No   CCA Family/Childhood History Family and Relationship History: Family history Marital status: Married Number of Years Married: 17 What types of issues is patient dealing with in the relationship?: Patient states he suspects his wife has been unfaithful in their relationship due to the fact that she recently had an STD screening and was prescribed medication.  He states that the reason he reinfect the house was to see if she was still on the STD medication Additional relationship information: Patient reports that he and  his wife are sleeping in separate bedrooms due to the suspected infidelity. Does patient have children?: Yes How many children?: 3 How is patient's relationship with their children?: Patient reports a good relationship he has 2 boys and 1 daughter.  Patient's wife dropped the boys off to spend time with patient on yesterday evening since he had been gone all week due to the recent hospitalization.  Childhood History:  Childhood History By whom was/is the patient raised?: Mother, Father Description of patient's current relationship with siblings: Patient reports good relationship with his siblings.  He states that his sister lives nearby but lives in a hoarding environment and he is unable to stay with her.  Patient reports having an older brother that he is gets along with well and a younger brother who they get along but is quiet and keeps to himself. Did patient suffer any verbal/emotional/physical/sexual abuse as a child?: No Did patient suffer from severe childhood neglect?: No Has patient ever been sexually abused/assaulted/raped as an adolescent or adult?: No Was the patient ever a victim of a crime or a disaster?: No Witnessed domestic violence?: No Has patient been affected by domestic violence as an adult?: No       CCA Substance Use Alcohol/Drug Use: Alcohol / Drug Use Pain Medications: N/A Prescriptions: N/A Over the Counter: N/A History of alcohol / drug use?: Yes Longest period of sobriety (when/how long): UTA Negative Consequences of Use:  (N/A) Withdrawal Symptoms: None Substance #1 Name of Substance 1: Alcohol 1 - Age of First Use: UTA 1 - Frequency: occasional 1 - Duration: ongoing 1 - Last Use / Amount: Last Sunday 1 - Method of Aquiring: purchase 1- Route of Use: oral                       ASAM's:  Six Dimensions of Multidimensional Assessment  Dimension 1:  Acute Intoxication and/or Withdrawal Potential:   Dimension 1:  Description of  individual's past and current experiences of substance use and withdrawal: pt reports occasional alcohol  use  Dimension 2:  Biomedical Conditions and Complications:   Dimension 2:  Description of patient's biomedical conditions and  complications: None reported  Dimension 3:  Emotional, Behavioral, or Cognitive Conditions and Complications:  Dimension 3:  Description of  emotional, behavioral, or cognitive conditions and complications: pt has diagnosis of PTSD from his time in the military  Dimension 4:  Readiness to Change:  Dimension 4:  Description of Readiness to Change criteria: Pt has been engaged in counseling to help manage problems in his marriage.  Dimension 5:  Relapse, Continued use, or Continued Problem Potential:  Dimension 5:  Relapse, continued use, or continued problem potential critiera description: None  Dimension 6:  Recovery/Living Environment:  Dimension 6:  Recovery/Iiving environment criteria description: Pt's wife wants him out of hte house.  ASAM Severity Score: ASAM's Severity Rating Score: 2  ASAM Recommended Level of Treatment: ASAM Recommended Level of Treatment: Level I Outpatient Treatment   Substance use Disorder (SUD) Substance Use Disorder (SUD)  Checklist Symptoms of Substance Use:  (Pt has occasional drink not abusive.)  Recommendations for Services/Supports/Treatments: Recommendations for Services/Supports/Treatments Recommendations For Services/Supports/Treatments: Medication Management  Disposition Recommendation per psychiatric provider: We recommend inpatient psychiatric hospitalization after medical hospitalization. Patient has been involuntarily committed on 04/26/2024.    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

## 2024-04-26 NOTE — ED Provider Notes (Signed)
 [facility] Continuous Assessment Admission H&P  Date: 04/26/24 Patient Name: Dennis Owens MRN: [id number] Chief Complaint: I was sitting on the couch  Diagnoses:  Final diagnoses:  Marital conflict   Chart reviewed and discussed with attending psychiatrist, Dr Kandi Hahn.   HPI: Per triage, Dennis Owens 38y male arrived to [facility] under IVC, petitioned by his wife. Please review IVC attachment. PT was calm and cooperative during triage denying SI, HI, AVH and alcohol and substance use. Per IVC, the pt recently attempted suicide and left his house in 30 degree weather w/ barely any clothes on. PT has hx of alcohol abuse and currently drinks everyday, pt is hostile and aggressive in his actions towards family members and is an immediate threat, pt has firearms and has brandished them to family members per IVC.   Pt is seen face-to-face on the [facility] Adult treatment area. Pt is alert & oriented x 4 and engages in today's visit. Today, states I was sitting on the couch when police came to serve him with IVC. Per the IVC petitioned by his wife, [name], the respondent has a history of prior commitment.  The respondent has recently attempted suicide and has left the house without a coat and barely any clothing and 30 degree weather.  The respondent has a history of alcohol abuse and currently drinks every day.  The respondent is hostile and aggressive in his actions towards family members and is an immediate threat.  The respondent has firearms and has brandished them to family members.  The respondent is a danger to himself and others.  Pt states he was recently hospitalized for suicidal ideation. He denies previous suicidal ideation and denies current suicidal ideation. State he drank a fourth to a one bottle of liquor Sunday night. Per chart review, patient was brought Dennis Owens ED for altered mental status after getting into an altercation with family members and eloping from the  premises with a firearm after having multiple alcoholic beverages.  He was later found lying in a parking lot unresponsive.  Patient was placed under IVC by the attending emergency department physician.  A psychiatric assessment was completed and patient was subsequently admitted to [facility] behavioral health for inpatient hospitalization.  During that admission, patient endorsed a lot of stress going on.  During most recent hospitalization patient endorsed that he and his wife were having marital conflict with his wife for the past 2 years, most recently he is suspicious of infidelity and he mentioned finding out about his wife's OB/GYN appointment and that she is using a cream for STDs, and has also found photos of a man on his wife's phone, and has observed them speaking with each other on the phone.   States he was released from [facility] on 04/25/2024. Pt states he went to his mother-in-laws home after discharge and was there with his sons for a few hours before deciding that he wanted to return to his own home. Pt states when he got home my wife said I need to leave the house. Pt states he saw hotel locks that he thinks his wife was planning to install to keep me out of the house. States that due to the marital conflict, he and his wife have been in separate rooms since March 24, 2024. States they have been individual counseling with plan to move to marriage counseling at some point. States he missed his last counseling appt due to being hospitalized. He endorses he  owns firearms ( 3 pistols, 2 shot guns, and 2 rifles). He states he does not know where his firearms are at this time. Pt denies suicidal or homicidal ideation, intent or plan. He denies AVH.   Collateral information obtained from patient's wife and IVC petitioner, Delon Salt, [phone]. Pt states she petitioned for IVC due to patient threatening to take the kids, acting unstable. States patient was at her mother's house after  hospital discharge. States husband was there for a few hours and he got demanding stating he wanted to go to his home to get some things and when he arrived to their home he sat in the chair and said he wasn't leaving and told her parents to get out. Wife states because of this her parents asked he not to come back to the house. Wife states she spoke with a deputy last night who advised her to take out an IVC. When asked about brandishing firearms, wife patient did not brandish firearm today but he has left them out in the past. States patient has not pointed a firearm at her or any family members since he has been out of the hospital on 04/25/2024. She is asked about the firearms. States the 3 pistols are with her and the other guns remain in home however she had the key to the gun safe but states patient went through her belongings and she is unsure if he found the key or not. Wife was asked to go to her home to check the status of the weapons. Wife states the patient had rambled in her belongings, but the key was still there. She was advised to check the gun safe to ensure both rifles and both shotguns remain in the safe. Wife states the rifles and shotguns remain in the gun safe as well as a fifth weapon without a barrel. She is advised to secure the key so that the patient will not be able to locate it. Wife states she does not feel comfortable with the patient returning home however does not verbalize any recent behaviors since hospital discharge indicating patient is a harm to himself or others. Wife states that patient is pulling our daughter into this and wanting her to spy on me and she has told me it's making her want to kill herself. Discussed with wife that IVC will not be upheld due to no behaviors indicating patient has attempted to harm himself or others.   Discussed with wife to see if patient returning to her parent's home was an option since this was the original plan when patient was  discharged from the hospital. Wife contacted her mother who wife states informed her she is not sure about patient returning there and that she wanted to think about it. Advised wife we would keep patient over night to allow time for mother to consider patient returning to her home or to find alternate housing. Wife verbalized understanding of this.   Discussed with patient wife's concerns and the documentation contained in the IVC. Pt agrees to stay voluntarily overnight on the continuous assessment area and will be reassessed in the morning. First exam completed and IVC rescinded.   Total Time spent with patient: 45 minutes  Musculoskeletal  Strength & Muscle Tone: within normal limits Gait & Station: normal Patient leans: N/A  Psychiatric Specialty Exam  Presentation General Appearance:  Appropriate for Environment; Fairly Groomed  Eye Contact: Good  Speech: Clear and Coherent; Normal Rate  Speech Volume: Normal  Handedness:Right  Mood and Affect  Mood: Euthymic  Affect: Congruent; Appropriate   Thought Process  Thought Processes: Coherent; Goal Directed  Descriptions of Associations:Intact  Orientation:Full (Time, Place and Person)  Thought Content:Logical  Diagnosis of Schizophrenia or Schizoaffective disorder in past: No   Hallucinations:Hallucinations: None  Ideas of Reference:None  Suicidal Thoughts:Suicidal Thoughts: No  Homicidal Thoughts:Homicidal Thoughts: No   Sensorium  Memory: Immediate Good; Recent Good  Judgment: Fair  Insight: Fair   Art Therapist  Concentration: Good  Attention Span: Good  Recall: Good  Fund of Knowledge: Good  Language: Good   Psychomotor Activity  Psychomotor Activity: Psychomotor Activity: Normal   Assets  Assets: Communication Skills; Desire for Improvement; Financial Resources/Insurance; Housing; Resilience; Vocational/Educational   Sleep  Sleep: Sleep:  Good   Nutritional Assessment (For OBS and FBC admissions only) Has the patient had a weight loss or gain of 10 pounds or more in the last 3 months?: No Has the patient had a decrease in food intake/or appetite?: No Does the patient have dental problems?: No Does the patient have eating habits or behaviors that may be indicators of an eating disorder including binging or inducing vomiting?: No Has the patient recently lost weight without trying?: 0 Has the patient been eating poorly because of a decreased appetite?: 0 Malnutrition Screening Tool Score: 0    Physical Exam Vitals and nursing note reviewed.  HENT:     Head: Normocephalic.     Mouth/Throat:     Mouth: Mucous membranes are moist.  Cardiovascular:     Rate and Rhythm: Normal rate.  Pulmonary:     Effort: Pulmonary effort is normal.  Musculoskeletal:        General: Normal range of motion.     Cervical back: Normal range of motion.  Skin:    General: Skin is warm and dry.  Neurological:     Mental Status: He is alert and oriented to person, place, and time.  Psychiatric:     Comments: See HPI    Review of Systems  Constitutional:  Negative for chills and fever.  HENT:  Negative for congestion and sore throat.   Respiratory:  Negative for cough and shortness of breath.   Cardiovascular:  Negative for chest pain and palpitations.  Gastrointestinal:  Negative for diarrhea, nausea and vomiting.  Psychiatric/Behavioral:  Negative for depression, substance abuse and suicidal ideas. The patient is not nervous/anxious.     Blood pressure 126/88, pulse 95, temperature 98.2 F (36.8 C), temperature source Temporal, resp. rate 17, SpO2 98%. There is no height or weight on file to calculate BMI.  Past Psychiatric History: MDD, PTSD Hospitalizations: [facility] (Dec, 2025)  Is the patient at risk to self? No  Has the patient been a risk to self in the past 6 months? Yes .    Has the patient been a risk to self within  the distant past? No   Is the patient a risk to others? No   Has the patient been a risk to others in the past 6 months? Yes   Has the patient been a risk to others within the distant past? No   Past Medical History: Denies. Surgeries: Vasectomy   Family History: No family history of mental health reported. Father: AUD  Social History: Lives with his wife of 17 years (staying in separate bedrooms), 3 children (41 yo daughter, 95 yo son, and 5 yo son). Works as a Primary School Teacher. Served 8 years in [employer], saw combat ([place]).  Honorable discharge. Access to firearms: Not at this time (as noted below)  Substance Abuse History: Alcohol: Endorses occasional binges, this incident during a quarter bottle of Garen Finder, last previous binge 13 years ago at bachelor party Tobacco: Denies Cannabis: Denies Cocaine: Denies Meth: Denies Other illicit drugs: Denies  Last Labs:  Admission on 04/21/2024, Discharged on 04/25/2024  Component Date Value Ref Range Status   Hgb A1c MFr Bld 04/22/2024 5.2  4.8 - 5.6 % Final   Comment: (NOTE)         Prediabetes: 5.7 - 6.4         Diabetes: >6.4         Glycemic control for adults with diabetes: <7.0    Mean Plasma Glucose 04/22/2024 103  mg/dL Final   Comment: (NOTE) Performed At: [facility] [address] [place], [place] [id number] Jennette Shorter MD Ey:[id number]    Cholesterol 04/22/2024 137  0 - 200 mg/dL Final   Comment:        ATP III CLASSIFICATION:  <200     mg/dL   Desirable  799-760  mg/dL   Borderline High  >=759    mg/dL   High           Triglycerides 04/22/2024 102  <150 mg/dL Final   HDL 87/97/7974 65  >40 mg/dL Final   Total CHOL/HDL Ratio 04/22/2024 2.1  RATIO Final   VLDL 04/22/2024 20  0 - 40 mg/dL Final   LDL Cholesterol 04/22/2024 52  0 - 99 mg/dL Final   Comment:        Total Cholesterol/HDL:CHD Risk Coronary Heart Disease Risk Table                     Men   Women  1/2 Average Risk   3.4   3.3  Average  Risk       5.0   4.4  2 X Average Risk   9.6   7.1  3 X Average Risk  23.4   11.0        Use the calculated Patient Ratio above and the CHD Risk Table to determine the patient's CHD Risk.        ATP III CLASSIFICATION (LDL):  <100     mg/dL   Optimal  899-870  mg/dL   Near or Above                    Optimal  130-159  mg/dL   Borderline  839-810  mg/dL   High  >809     mg/dL   Very High Performed at [facility], [address] [place], [place] [address]   Admission on 04/21/2024, Discharged on 04/21/2024  Component Date Value Ref Range Status   WBC 04/21/2024 6.2  4.0 - 10.5 K/uL Final   RBC 04/21/2024 4.81  4.22 - 5.81 MIL/uL Final   Hemoglobin 04/21/2024 15.6  13.0 - 17.0 g/dL Final   HCT 87/98/7974 44.1  39.0 - 52.0 % Final   MCV 04/21/2024 91.7  80.0 - 100.0 fL Final   MCH 04/21/2024 32.4  26.0 - 34.0 pg Final   MCHC 04/21/2024 35.4  30.0 - 36.0 g/dL Final   RDW 87/98/7974 12.6  11.5 - 15.5 % Final   Platelets 04/21/2024 187  150 - 400 K/uL Final   nRBC 04/21/2024 0.0  0.0 - 0.2 % Final   Neutrophils Relative % 04/21/2024 73  % Final   Neutro  Abs 04/21/2024 4.5  1.7 - 7.7 K/uL Final   Lymphocytes Relative 04/21/2024 16  % Final   Lymphs Abs 04/21/2024 1.0  0.7 - 4.0 K/uL Final   Monocytes Relative 04/21/2024 11  % Final   Monocytes Absolute 04/21/2024 0.7  0.1 - 1.0 K/uL Final   Eosinophils Relative 04/21/2024 0  % Final   Eosinophils Absolute 04/21/2024 0.0  0.0 - 0.5 K/uL Final   Basophils Relative 04/21/2024 0  % Final   Basophils Absolute 04/21/2024 0.0  0.0 - 0.1 K/uL Final   Immature Granulocytes 04/21/2024 0  % Final   Abs Immature Granulocytes 04/21/2024 0.02  0.00 - 0.07 K/uL Final   Performed at [facility], [address], [place], [place] [address]   Sodium 04/21/2024 143  135 - 145 mmol/L Final   Potassium 04/21/2024 3.9  3.5 - 5.1 mmol/L Final   Chloride 04/21/2024 107  98 - 111 mmol/L Final   CO2 04/21/2024 21  (L)  22 - 32 mmol/L Final   Glucose, Bld 04/21/2024 109 (H)  70 - 99 mg/dL Final   Glucose reference range applies only to samples taken after fasting for at least 8 hours.   BUN 04/21/2024 8  6 - 20 mg/dL Final   Creatinine, Ser 04/21/2024 0.90  0.61 - 1.24 mg/dL Final   Calcium 87/98/7974 9.0  8.9 - 10.3 mg/dL Final   Total Protein 87/98/7974 7.5  6.5 - 8.1 g/dL Final   Albumin 87/98/7974 4.5  3.5 - 5.0 g/dL Final   AST 87/98/7974 44 (H)  15 - 41 U/L Final   HEMOLYSIS AT THIS LEVEL MAY AFFECT RESULT   ALT 04/21/2024 68 (H)  0 - 44 U/L Final   Alkaline Phosphatase 04/21/2024 70  38 - 126 U/L Final   Total Bilirubin 04/21/2024 0.4  0.0 - 1.2 mg/dL Final   GFR, Estimated 04/21/2024 >60  >60 mL/min Final   Comment: (NOTE) Calculated using the CKD-EPI Creatinine Equation (2021)    Anion gap 04/21/2024 15  5 - 15 Final   Performed at [facility], [address], [place], [place] [address]   Lipase 04/21/2024 27  11 - 51 U/L Final   Performed at [facility], [address], [place], [place] [address]   Acetaminophen  (Tylenol ), Serum 04/21/2024 <10 (L)  10 - 30 ug/mL Final   Comment: (NOTE) Toxic concentrations can be more effectively related to post dose interval; >200, >100, and >50 ug/mL serum concentrations correspond to toxic concentrations at 4, 8, and 12 hours post dose, respectively.  Performed at [facility], [address] [place], [place] [address]    Salicylate Lvl 04/21/2024 <7.0 (L)  7.0 - 30.0 mg/dL Final   Performed at [facility], [address] [place], [place] [address]   Alcohol, Ethyl (B) 04/21/2024 54 (H)  <15 mg/dL Final   Comment: (NOTE) For medical purposes only. Performed at [facility], [address] [place], [place] [address]    Opiates 04/21/2024 NEGATIVE  NEGATIVE Final   Cocaine 04/21/2024 NEGATIVE  NEGATIVE Final   Benzodiazepines 04/21/2024  NEGATIVE  NEGATIVE Final   Amphetamines 04/21/2024 NEGATIVE  NEGATIVE Final   Tetrahydrocannabinol 04/21/2024 NEGATIVE  NEGATIVE Final   Barbiturates 04/21/2024 NEGATIVE  NEGATIVE Final   Methadone Scn, Ur 04/21/2024 NEGATIVE  NEGATIVE Final   Fentanyl  04/21/2024 NEGATIVE  NEGATIVE Final   Comment: (NOTE) Drug screen is for Medical Purposes only. Positive results  are preliminary only. If confirmation is needed, notify lab within 5 days.  Drug Class                 Cutoff (ng/mL) Amphetamine and metabolites 1000 Barbiturate and metabolites 200 Benzodiazepine              200 Opiates and metabolites     300 Cocaine and metabolites     300 THC                         50 Fentanyl                     5 Methadone                   300  Trazodone  is metabolized in vivo to several metabolites,  including pharmacologically active m-CPP, which is excreted in the  urine.  Immunoassay screens for amphetamines and MDMA have potential  cross-reactivity with these compounds and may provide false positive  result.  Performed at [facility], [address], [place], [place] [address]    Color, Urine 04/21/2024 YELLOW  YELLOW Final   APPearance 04/21/2024 CLEAR  CLEAR Final   Specific Gravity, Urine 04/21/2024 1.008  1.005 - 1.030 Final   pH 04/21/2024 6.0  5.0 - 8.0 Final   Glucose, UA 04/21/2024 NEGATIVE  NEGATIVE mg/dL Final   Hgb urine dipstick 04/21/2024 NEGATIVE  NEGATIVE Final   Bilirubin Urine 04/21/2024 NEGATIVE  NEGATIVE Final   Ketones, ur 04/21/2024 NEGATIVE  NEGATIVE mg/dL Final   Protein, ur 87/98/7974 NEGATIVE  NEGATIVE mg/dL Final   Nitrite 87/98/7974 NEGATIVE  NEGATIVE Final   Leukocytes,Ua 04/21/2024 NEGATIVE  NEGATIVE Final   Performed at [facility], [address], [place], [place] [address]    Allergies: Penicillins  Medications:  Facility Ordered Medications  Medication   sertraline  (ZOLOFT ) tablet 50 mg   acetaminophen   (TYLENOL ) tablet 650 mg   alum & mag hydroxide-simeth (MAALOX/MYLANTA) 200-200-20 MG/5ML suspension 30 mL   magnesium  hydroxide (MILK OF MAGNESIA) suspension 30 mL   OLANZapine  zydis (ZYPREXA ) disintegrating tablet 5 mg   OLANZapine  (ZYPREXA ) injection 5 mg   OLANZapine  (ZYPREXA ) injection 10 mg   traZODone  (DESYREL ) tablet 50 mg   PTA Medications  Medication Sig   sertraline  (ZOLOFT ) 50 MG tablet Take 1 tablet (50 mg total) by mouth daily.      Medical Decision Making     Recommendations  Based on my evaluation the patient does not appear to have an emergency medical condition.  Admit to [facility] continuous observation overnight for continued supervision and assistance with the de-escalation of this crisis which could result in the avoidance of unnecessary hospitalization or higher level of care  Sherrell Culver, PMHNP-BC, FNP-BC  04/26/24  7:35 PM

## 2024-04-26 NOTE — Progress Notes (Signed)
 Pt is admitted  due to IVC petition by his spouse. Pt is alert and oriented X4 with flat affect. Pt is ambulatory and is oriented to staff/unit. Pt was cooperative with skin assessment. Pt denies pain and current SI/HI/AVH, plan or intent. Staff will monitor for pt's safety.

## 2024-04-26 NOTE — ED Notes (Addendum)
 Patient is alert and oriented x 4 with no acute distress noted.  Patient denies SI/HI/AVH at this time.  Patient has been observed resting at this time.  Patient was compliant with scheduled medications during shift.  Food and Drink offered.  No issues noted or concerns voiced at this time.  Will continue Q 15 min safety checks for safety/behavior per facility protocol.

## 2024-04-26 NOTE — Progress Notes (Addendum)
 Patient is currently resting with eyes closed at this time.  No acute distress noted.  Respirations present, even and unlabored.  No issues noted at this time.  Will continue Q 15 min safety checks for safety/behavior per facility protocol.

## 2024-04-27 NOTE — Progress Notes (Signed)
 Patient is currently resting with eyes closed at this time.  No acute distress noted.  Respirations present, even and unlabored.  No issues noted.  Will continue Q 15 min safety checks for safety/behavior per facility protocol.

## 2024-04-27 NOTE — Discharge Instructions (Addendum)
 Discharge Recommendations:   Medications: Please pick up sertraline  Commonly known as: ZOLOFT  Take 1 tablet (50 mg total) by mouth daily.Take medications as prescribed. The patient or patient's guardian is to contact a medical professional and/or outpatient provider to address any new side effects that develop. The patient or the patient's guardian should update outpatient providers of any new medications and/or medication changes.   *Outpatient Follow up: Per your hospital discharge summary from 04/25/2024, the following appointment was scheduled for you:  [facility] Follow up on 05/08/2024.   Why: You have an appointment for medication management services on 05/08/24 at 2:00 pm.  This will be a Virtual appointment, but you may call to change to in person. Contact information: [address] [place] [address] [phone]  Therapy: We recommend that patient participate in individual therapy to address mental health concerns. Per your hospital discharge summary from 04/25/2024, you have the following appointment scheduled:   [facility] Go on 05/01/2024.   Why: You have an appointment for therapy services on 05/01/24 at 11:00 am with Lamar, in person.  Intake paperwork will be sent to your email.  Please submit 48 hours prior to your appointment, if possible. Contact information: [address] [place] [address] [phone]  Safety:   The following safety precautions should be taken:   No sharp objects. This includes scissors, razors, scrapers, and putty knives.   Chemicals should be removed and locked up.   Medications should be removed and locked up.   Weapons should be removed and locked up. This includes firearms, knives and instruments that can be used to cause injury.   The patient should abstain from use of illicit substances/drugs and abuse of any medications.  If symptoms worsen or do not continue to improve or if the patient  becomes actively suicidal or homicidal then it is recommended that the patient return to the closest hospital emergency department, the [facility], or call 911 for further evaluation and treatment.  National Suicide Prevention Lifeline 1-800-SUICIDE or 2511992030.  About 988 988 offers 24/7 access to trained crisis counselors who can help people experiencing mental health-related distress. People can call or text 988 or chat 988lifeline.org for themselves or if they are worried about a loved one who may need crisis support.

## 2024-04-27 NOTE — ED Provider Notes (Cosign Needed)
 FBC/OBS ASAP Discharge Summary  Date and Time: 04/27/2024 4:17 PM  Name: Dennis Owens  MRN:  [id number]   Discharge Diagnoses:  Final diagnoses:  Marital conflict   Chart reviewed and discussed with attending psychiatrist, Dr Kandi Hahn.  Subjective: I'm ok   Stay Summary: Patient presented to [facility] on 04/26/2024 under IVC petitioned by his wife, Dennis Owens, the respondent has a history of prior commitment.  The respondent has recently attempted suicide and has left the house without a coat and barely any clothing and 30 degree weather.  The respondent has a history of alcohol abuse and currently drinks every day.  The respondent is hostile and aggressive in his actions towards family members and is an immediate threat.  The respondent has firearms and has brandished them to family members.  The respondent is a danger to himself and others.   Pt stated that due to the marital conflict, he and his wife have been in separate rooms since March 24, 2024. Wife stated she did not feel comfortable with the patient returning home however did not verbalize any recent behaviors since hospital discharge on 04/25/2024 indicating patient is a harm to himself or others. Discussed with wife that IVC will not be upheld due to no behaviors indicating patient has attempted to or is at imminent risk of harm to himself or others. Discussed with wife to see if patient returning to her parent's home was an option since this was the original plan when patient was discharged from the hospital. Wife contacted her mother who wife stated informed her she is not sure about patient returning there and that she wanted to think about it.   Pt endorsed owning firearms (3 pistols, 2 shotguns and 2 rifles). When asked about brandishing firearms, wife states patient did not brandish firearm on the day of IVC petition, but he has left them out in the past. Wife states patient has not pointed a firearm at her or  any family members since he has been out of the hospital on 04/25/2024. Wife confirmed the rifles and shotguns remain in the gun safe as well as a fifth weapon without a barrel. She was advised to secure the key so that the patient is not able to locate it. Advised wife we would keep patient over night to allow time for mother-in-law to consider patient returning to her home or to find alternate housing. Patient agreed to stay voluntarily overnight on the continuous assessment unit and to have reassessment in the morning. First exam was completed and IVC was rescinded.   Today, nursing documentation reviewed. Patient slept overnight and has remained calm and appropriate on the unit. Pt has not required any as needed medications for agitation. Pt states he spoke with mother-in-law this morning and she said I can't stay there. He provides this practitioner with phone number for his mother-in-law, [name], [phone]. This practitioner spoke with mother-in-law who states I'm a little concern when he gets hisself worked up. She states that on 12/5 she took him to his house to get his phone and truck but when he got there he saw latch locks and got very upset but I was able to calm him down due to thinking his wife was trying to change the locks. States the patient changed his mind about returning to her home and opted to stay in his home. Mother-in-law states that the patient did not confront her husband and that she did not feel unsafe or in danger.  States she took her grandsons and returned to her house. Wife also got on the call today and states that she spoke to the patient this morning and advised him that going to her mother's home was not an option. This practitioner discussed with the patient about other options for housing. He states he has a couple of friend's he may be able to stay with. He continues to be cooperative and engages in today's assessment. He continues to deny suicidal or homicidal  ideation, intent or plan. He denies AVH.   1502 Pt advised that he had spoken with his sister, [name] and that she agreed to let him stay with her. Made contact with sister via phone at [phone] who states she agrees to let patient stay with her. States the patient's wife has been in touch with but I don't know what all is going on. Advised sister, that due to not having a consent on file, this practitioner was unable to discuss patient's current medical treatment. Sister was advised that pt was placed under IVC that has since been rescinded. Confirmed with sister that there are no weapons in her home that the patient would has access to and she confirms there are no weapons or guns in her home. States she will coordinate with the patient's wife to get some belongings, work ambulance person, and his truck. Sister advises that she is at a parade and that it will be after 5p before she is able to pick up patient.   Patient does not display evidence of psychosis/mania, delusional thinking, or significant mental health impairment. He is able to converse coherently, with goal directed thoughts, and no distractibility, or pre-occupation. He has responded appropriately to questions and has remained calm and cooperative throughout assessment.  While future psychiatric events cannot be accurately predicted, the patient does not currently require acute inpatient psychiatric care and does not currently meet [place]  involuntary commitment criteria. Patient was able to engage in safety planning including plan to return to [facility] or contact emergency services if he feels unable to maintain his own safety or the safety of others.   Total Time spent with patient: 20 minutes  Past Psychiatric History: MDD, PTSD Hospitalizations: [facility] (Dec, 2025) Past Medical History: Denies. Surgeries: Vasectomy    Family History: No family history of mental health reported. Father: AUD   Social History: Lives with his wife of 17  years (staying in separate bedrooms), 3 children (31 yo daughter, 36 yo son, and 58 yo son). Works as a Primary School Teacher. Served 8 years in [employer], saw combat ([place]). Honorable discharge. Access to firearms: Not at this time (as noted below)   Substance Abuse History: Alcohol: Endorses occasional binges, 11/30 his incident during a quarter bottle of [employer], last previous binge 13 years ago at bachelor party Tobacco: Denies Cannabis: Denies Cocaine: Denies Meth: Denies Other illicit drugs: Denies Tobacco Cessation:  N/A, patient does not currently use tobacco products  Current Medications:  Current Facility-Administered Medications  Medication Dose Route Frequency Provider Last Rate Last Admin   acetaminophen  (TYLENOL ) tablet 650 mg  650 mg Oral Q6H PRN Hearl Heikes E, NP       alum & mag hydroxide-simeth (MAALOX/MYLANTA) 200-200-20 MG/5ML suspension 30 mL  30 mL Oral Q4H PRN Ara Mano E, NP       magnesium  hydroxide (MILK OF MAGNESIA) suspension 30 mL  30 mL Oral Daily PRN Daniele Yankowski E, NP       OLANZapine  (ZYPREXA ) injection 10  mg  10 mg Intramuscular TID PRN Roarke Marciano E, NP       OLANZapine  (ZYPREXA ) injection 5 mg  5 mg Intramuscular TID PRN Lux Meaders E, NP       OLANZapine  zydis (ZYPREXA ) disintegrating tablet 5 mg  5 mg Oral TID PRN Maysoon Lozada E, NP       sertraline  (ZOLOFT ) tablet 50 mg  50 mg Oral Daily Sylas Twombly E, NP   50 mg at 04/27/24 1022   traZODone  (DESYREL ) tablet 50 mg  50 mg Oral QHS PRN Aluel Schwarz E, NP       Current Outpatient Medications  Medication Sig Dispense Refill   sertraline  (ZOLOFT ) 50 MG tablet Take 1 tablet (50 mg total) by mouth daily. 30 tablet 0    PTA Medications:  Facility Ordered Medications  Medication   sertraline  (ZOLOFT ) tablet 50 mg   acetaminophen  (TYLENOL ) tablet 650 mg   alum & mag hydroxide-simeth (MAALOX/MYLANTA) 200-200-20 MG/5ML suspension 30 mL   magnesium  hydroxide (MILK OF MAGNESIA) suspension 30 mL    OLANZapine  zydis (ZYPREXA ) disintegrating tablet 5 mg   OLANZapine  (ZYPREXA ) injection 5 mg   OLANZapine  (ZYPREXA ) injection 10 mg   traZODone  (DESYREL ) tablet 50 mg   PTA Medications  Medication Sig   sertraline  (ZOLOFT ) 50 MG tablet Take 1 tablet (50 mg total) by mouth daily.       08/14/2022    2:59 PM 10/10/2021    8:51 AM 07/18/2021    1:15 PM  Depression screen PHQ 2/9  Decreased Interest 0 0 0  Down, Depressed, Hopeless 0 0 0  PHQ - 2 Score 0 0 0  Altered sleeping 0 0 1  Tired, decreased energy 0 0 0  Change in appetite 0 0 0  Feeling bad or failure about yourself  0 0 0  Trouble concentrating 0 0 0  Moving slowly or fidgety/restless 0 0 0  Suicidal thoughts 0 0 0  PHQ-9 Score 0  0  1      Data saved with a previous flowsheet row definition    Flowsheet Row ED from 04/26/2024 in [facility] Most recent reading at 04/26/2024  8:45 PM Ad[facility] (Dis[facility] Most recent reading at 04/21/2024 11:00 AM ED from 04/21/2024 in [facility] Most recent reading at 04/21/2024  6:50 AM  C-SSRS RISK CATEGORY Error: Q3, 4, or 5 should not be populated when Q2 is No High Risk High Risk    Musculoskeletal  Strength & Muscle Tone: within normal limits Gait & Station: normal Patient leans: N/A  Psychiatric Specialty Exam  Presentation  General Appearance:  Appropriate for Environment; Fairly Groomed  Eye Contact: Good  Speech: Clear and Coherent; Normal Rate  Speech Volume: Normal  Handedness: Right   Mood and Affect  Mood: Euthymic  Affect: Appropriate; Congruent   Thought Process  Thought Processes: Coherent; Goal Directed  Descriptions of Associations:Intact  Orientation:Full (Time, Place and Person)  Thought Content:Logical  Diagnosis of Schizophrenia or Schizoaffective disorder in past: No     Hallucinations:Hallucinations: None  Ideas of Reference:None  Suicidal Thoughts:Suicidal Thoughts: No  Homicidal Thoughts:Homicidal Thoughts: No   Sensorium  Memory: Immediate Good; Recent Good  Judgment: Good  Insight: Good   Executive Functions  Concentration: Good  Attention Span: Good  Recall: Good  Fund of Knowledge: Good  Language: Good   Psychomotor Activity  Psychomotor Activity: Psychomotor Activity: Normal  Assets  Assets: Manufacturing Systems Engineer; Desire for Improvement; Housing; Health And Safety Inspector; Resilience   Sleep  Sleep:Sleep: Good  No Safety Checks orders active in given range  Nutritional Assessment (For OBS and FBC admissions only) Has the patient had a weight loss or gain of 10 pounds or more in the last 3 months?: No Has the patient had a decrease in food intake/or appetite?: No Does the patient have dental problems?: No Does the patient have eating habits or behaviors that may be indicators of an eating disorder including binging or inducing vomiting?: No Has the patient recently lost weight without trying?: 0 Has the patient been eating poorly because of a decreased appetite?: 0 Malnutrition Screening Tool Score: 0    Physical Exam  Physical Exam Vitals and nursing note reviewed.  HENT:     Head: Normocephalic.     Mouth/Throat:     Mouth: Mucous membranes are moist.  Cardiovascular:     Rate and Rhythm: Normal rate.  Pulmonary:     Effort: Pulmonary effort is normal.  Musculoskeletal:        General: Normal range of motion.  Skin:    General: Skin is warm and dry.  Neurological:     Mental Status: He is alert and oriented to person, place, and time.  Psychiatric:     Comments: See HPI    Review of Systems  Constitutional:  Negative for chills and fever.  HENT:  Negative for congestion and sore throat.   Respiratory:  Negative for cough and shortness of breath.   Cardiovascular:  Negative for chest  pain and palpitations.  Psychiatric/Behavioral:  Negative for hallucinations, substance abuse and suicidal ideas. The patient is not nervous/anxious.    Blood pressure 137/88, pulse 75, temperature 98 F (36.7 C), temperature source Oral, resp. rate 18, SpO2 100%. There is no height or weight on file to calculate BMI.  Demographic Factors:  Male and Caucasian  Loss Factors: NA  Historical Factors: Impulsivity  Risk Reduction Factors:   Responsible for children under 80 years of age, Sense of responsibility to family, Employed, and Living with another person, especially a relative  Continued Clinical Symptoms:  N/A  Cognitive Features That Contribute To Risk:  None    Suicide Risk:  Minimal: No identifiable suicidal ideation.  Patients presenting with no risk factors but with morbid ruminations; may be classified as minimal risk based on the severity of the depressive symptoms  Plan Of Care/Follow-up recommendations:  Activity:  as tolerated Diet:  regular Tests:  as determined by outpatient medical and/or psychiatric provider  Disposition: Discharge home. His sister, [name], will pick him up and he will stay with his sister, [name].   The patient has several protective factors to include employment, responsibility for family and no previous suicide attempts. Patient was advised to pick up sertraline  from pharmacy. He has been provided with appt dates and times for outpatient psychiatrist and outpatient counseling that were scheduled at the time of hospital discharge on 04/25/2024. He is encouraged to keep these appts.   Sherrell Culver, PMHNP-BC, FNP-BC  04/27/2024, 4:17 PM

## 2024-04-28 NOTE — H&P (Signed)
 Psychiatric Admission Assessment Adult  Patient Identification:  Dennis Owens MRN:  [id number] Date of Evaluation: 04/22/2024 Chief Complaint:  Major depressive disorder, recurrent severe without psychotic features (HCC) [F33.2] Principal Diagnosis:  Major depressive disorder, recurrent severe without psychotic features (HCC) Diagnosis:  Principal Problem:   Major depressive disorder, recurrent severe without psychotic features (HCC) Active Problems:   PTSD (post-traumatic stress disorder)   Complicated grief  SUBJECTIVE:  CC:   a lot of stress going on  HPI: Dennis Owens is a 38 y.o. male  with a past psychiatric history of PTSD. Patient initially arrived to [facility] on 12/1 for AMS and was admitted to [facility] under IVC on 12/1 for acute safety concerns, crisis stabalization, acute suicidal or self-harming behaviors, and substance related issues. PMHx is unremarkable.  On assessment, patient is friendly and forthcoming about the stressors in his life contributing to his psychiatric hospitalization.  Patient says in 2007 he lost his roommate in [place], he lost his mother September 11 this year, and has been having marital issues with his wife for the past 2 years, most recently he is suspicious of infidelity given she was able to schedule an appointment with her OB/GYN very quickly for STD testing.  Patient gives disorganized recollection of events leading to this hospitalization, attributing this to a slip up and inappropriate coping strategies.  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 Patient says he knows she is cheating on him because she is using a cream for STDs, and has also found photos of a man on his wife's phone, and has observed them speaking with each other  on the phone.  Gently confronted patient about need to take a gun with him into the middle of the night while he is drunk, patient says he was in a bad area of town and needed it for protection.  Patient says he recently started individual therapy at the end of October at the [facility], and began doing couples therapy with his wife. Patient denies feeling depressed, says he is a really happy guy, denies problems with sleep, anhedonia, decreased energy, appetite concerns, suicidal ideation, though endorses guilt from the death of his roommate.  Patient denies feeling anxious, denies nightmares, denies social anxiety, says he tries to get out more when he is feeling down or wrestle with his kids.  Discussed with patient starting antidepressant and continuing individual therapy and PTSD specific therapy, though patient denies wanting to begin a medication.  Patient denies having difficulty coping with stressors in his life,  Attempted to call Quincey Quesinberry, spouse, [phone], will attempt again later.  Upheld IVC.  Psychiatric ROS Mood Symptoms denies  Anxiety Symptoms Denies anxious mood, social anxiety  Trauma Symptoms Positive for reexperiencing and withdrawal, denies nightmares though endorses his wife has witnessed him doing CPR on the bed in his sleep  Psychosis Symptoms Denies AVH  Past Psychiatric History: Current psychiatrist: Denies Current therapist:  Previous psychiatric diagnoses: PTSD Psychiatric Medications: Denies Psychiatric hospitalization(s): Denies Psychotherapy history: Started individual therapy in the last week of October, currently in couples therapy with his wife at the [facility] History of suicide (obtained from HPI): Denies NSSIB: Denies  Substance Abuse History: Alcohol: Endorses occasional binges, this incident during a quarter bottle  of Garen Finder, last previous binge 13 years ago at bachelor party Tobacco: Denies Cannabis: Denies Cocaine: Denies Meth:  Denies Other illicit drugs: Denies  Past Medical History: Medical diagnoses: Denies Medications: Denies Allergies: none PCP: Powell Lessen, NP Hospitalizations: Denies Surgeries: Vasectomy  Seizures: Denies  Social History: Current living situation: Lives at home with his wife of 17 years and they have 3 children, ages 87, 86 and 4 and a dog Education: Graduated high school 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 Marital status: Married 17 years Children: 3 children ages 35, 34 and 4  Military: Electronics Engineer; 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 [place]  Access to firearms: yes  Family Psychiatric History: Psychiatric diagnoses: Denies Substance use history: Father -- AUD   Total Time spent with patient: 1 hour   Columbia Scale:  Flowsheet Row [facility] Most recent reading at 04/21/2024 11:00 AM ED from 04/21/2024 in [facility] Emergency Department at [facility] Most recent reading at 04/21/2024  6:50 AM Admission (Discharged) from 03/28/2022 in [facility] Most recent reading at 03/28/2022  9:58 AM  C-SSRS RISK CATEGORY High Risk High Risk No Risk     Tobacco Screening:  Social History   Tobacco Use  Smoking Status Never  Smokeless Tobacco Never    BH Tobacco Counseling     Are you interested in Tobacco Cessation Medications?  No, patient refused Counseled patient on smoking cessation:  Refused/Declined practical counseling Reason Tobacco Screening Not Completed: Patient Refused Screening    Allergies:   Allergies  Allergen Reactions   Penicillins Rash   Shellfish Allergy Rash    OBJECTIVE: Physical Examination:  Physical Exam Constitutional:      General: He is not in acute distress.    Appearance: He is not toxic-appearing or diaphoretic.  Pulmonary:     Effort: Pulmonary effort is normal.  Neurological:     General: No focal deficit present.     Mental Status: He is  alert.    Review of Systems  Constitutional:  Negative for diaphoresis.  Neurological:  Negative for dizziness and headaches.   Blood pressure 124/80, pulse (!) 59, temperature 97.8 F (36.6 C), temperature source Oral, resp. rate 16, height 5' 10 (1.778 m), weight 101.6 kg, SpO2 99%. Body mass index is 32.14 kg/m.  Metabolic disorder labs:  Lab Results  Component Value Date   HGBA1C 5.2 04/22/2024   MPG 103 04/22/2024   No results found for: PROLACTIN Lab Results  Component Value Date   CHOL 137 04/22/2024   TRIG 102 04/22/2024   HDL 65 04/22/2024   CHOLHDL 2.1 04/22/2024   VLDL 20 04/22/2024   LDLCALC 52 04/22/2024   LDLCALC 74 10/03/2021    No results found for this or any previous visit (from the past 48 hours).   Blood alcohol level:  Lab Results  Component Value Date   ETH 54 (H) 04/21/2024    Current Medications: No current facility-administered medications for this encounter.   Current Outpatient Medications  Medication Sig Dispense Refill   sertraline  (ZOLOFT ) 50 MG tablet Take 1 tablet (50 mg total) by mouth daily. 30 tablet 0    PTA Medications: No medications prior to admission.    Mental Status Exam:  Appearance: Casual, white male sitting up in bed  Behavior: Relaxed, maintains good eye contact  Attitude: Cooperative  Speech: Normal tone, prosody, inflection and volume  Mood: Euthymic  Affect: Congruent, full range  Thought Process: Linear,  goal directed  Thought Content: WNL  SI/HI: Denies  Perceptions: Not appear to be responding to internal stimuli, denies AVH  Judgement: fair  Insight: poor  Fund of Knowledge: WNL    ASSESSMENT: This patient chronically suffers from posttraumatic stress disorder given his symptoms of reexperiencing, complicated nightmares and withdrawal after witnessing a traumatic event. Patient was counseled on the potential severity of this diagnosis, however has minimal insight. The diagnosis for this  admission is substance-induced mood disorder given patient was under the influence of alcohol before wandering into the night with a gun, per IVC making suicidal statements.  Discussed with patient most efficacious treatment for PTSD is combination medication and therapy. Patient resistant to starting medication, so we will have lengthy discussion with joint decision-making before beginning antidepressant.  PLAN: Psychiatric Diagnoses and Treatment # Substance-induced mood disorder # PTSD - Continue CIWA protocol with as needed Ativan  - Begin antidepressant after thorough risks/benefit discussion - Continue thiamine  100 mg daily  PRN's - Trazodone  50 mg at bedtime as needed for insomnia - Atarax  25 mg TID as needed for anxiety - Agitation Protocol: Haldol  + Benadryl  + Ativan   2. Active Medical Issues #Nicotine withdrawal - Patient does not need nicotine replacement  Other as needed medications  Tylenol  650 mg every 6 hours as needed for pain Mylanta 30 mL every 4 hours as needed for indigestion Milk of magnesia 30 mL daily as needed for constipation  The risks/benefits/side-effects/alternatives to the above medication(s) were discussed in detail with the patient and time was given for questions. The patient consents to medication trial. FDA black box warnings, if present, were discussed.  3. Safety and Monitoring: - Involuntary admission to inpatient psychiatric unit for safety, stabilization and treatment - Daily contact with patient to assess and evaluate symptoms and progress in treatment - Patient's case to be discussed in multi-disciplinary team meeting - Observation Level: q15 minute checks - Vital signs:  q12 hours - Precautions: suicide, elopement, and assault  4. Routine and other pertinent labs: EKG monitoring: QTc: 448  Metabolism / endocrine: BMI: Body mass index is 32.14 kg/m.  CBC: unremarkable CMP: AST 44, ALT 68 UDS: negative Ethanol: 54 UA:  unremarkable A1c: Ordered Lipid panel: Ordered  5.   Group Therapy: - Encouraged patient to participate in unit milieu and in scheduled group therapies  - Short Term Goals: Ability to identify and develop effective coping behaviors will improve - Long Term Goals: Improvement in symptoms so as ready for discharge - Patient is encouraged to participate in group therapy while admitted to the psychiatric unit. - We will address other chronic and acute stressors, which contributed to the patient's Major depressive disorder, recurrent severe without psychotic features (HCC) in order to reduce the risk of self-harm at discharge.  7.   Discharge Planning:  - Social work and case management to assist with discharge planning and identification of hospital follow-up needs prior to discharge - Estimated LOS: 3-5 days  - Discharge Concerns: Need to establish a safety plan; Medication compliance and effectiveness - Discharge Goals: Return home with outpatient referrals for mental health follow-up including medication management/psychotherapy  I certify that inpatient services furnished can reasonably be expected to improve the patient's condition.      Alfornia Light, DO, PGY-1, Psychiatry Residency  12/8/20254:24 PM
